# Patient Record
Sex: Male | Born: 1965 | Race: Black or African American | Hispanic: No | Marital: Married | Smoking: Current every day smoker
Health system: Southern US, Community
[De-identification: ages and names within clinical notes are randomized; demographics above are authoritative.]

## PROBLEM LIST (undated history)

## (undated) DIAGNOSIS — I10 Essential (primary) hypertension: Secondary | ICD-10-CM

## (undated) DIAGNOSIS — E7800 Pure hypercholesterolemia, unspecified: Secondary | ICD-10-CM

---

## 2005-07-12 ENCOUNTER — Ambulatory Visit: Payer: Self-pay | Admitting: Family Medicine

## 2005-07-26 ENCOUNTER — Encounter (INDEPENDENT_AMBULATORY_CARE_PROVIDER_SITE_OTHER): Payer: Self-pay | Admitting: Family Medicine

## 2006-03-12 ENCOUNTER — Encounter: Payer: Self-pay | Admitting: Family Medicine

## 2007-06-19 ENCOUNTER — Encounter (INDEPENDENT_AMBULATORY_CARE_PROVIDER_SITE_OTHER): Payer: Self-pay | Admitting: Family Medicine

## 2008-02-04 ENCOUNTER — Encounter (INDEPENDENT_AMBULATORY_CARE_PROVIDER_SITE_OTHER): Payer: Self-pay | Admitting: Family Medicine

## 2008-08-21 ENCOUNTER — Encounter (INDEPENDENT_AMBULATORY_CARE_PROVIDER_SITE_OTHER): Payer: Self-pay | Admitting: *Deleted

## 2008-10-15 ENCOUNTER — Ambulatory Visit: Payer: Self-pay | Admitting: Internal Medicine

## 2008-10-15 ENCOUNTER — Telehealth (INDEPENDENT_AMBULATORY_CARE_PROVIDER_SITE_OTHER): Payer: Self-pay

## 2008-10-15 DIAGNOSIS — K922 Gastrointestinal hemorrhage, unspecified: Secondary | ICD-10-CM | POA: Insufficient documentation

## 2008-10-15 DIAGNOSIS — F1010 Alcohol abuse, uncomplicated: Secondary | ICD-10-CM

## 2008-10-17 ENCOUNTER — Encounter: Payer: Self-pay | Admitting: Gastroenterology

## 2008-10-19 LAB — CONVERTED CEMR LAB
Basophils Absolute: 0 10*3/uL (ref 0.0–0.1)
HCT: 39.9 % (ref 39.0–52.0)
Hemoglobin: 13.4 g/dL (ref 13.0–17.0)
Lymphs Abs: 1 10*3/uL (ref 0.7–4.0)
MCHC: 33.6 g/dL (ref 30.0–36.0)
MCV: 89.3 fL (ref 78.0–100.0)
Monocytes Relative: 15 % — ABNORMAL HIGH (ref 3–12)
WBC: 6.5 10*3/uL (ref 4.0–10.5)

## 2008-10-22 ENCOUNTER — Ambulatory Visit (HOSPITAL_COMMUNITY): Admission: RE | Admit: 2008-10-22 | Discharge: 2008-10-22 | Payer: Self-pay | Admitting: Internal Medicine

## 2008-10-22 ENCOUNTER — Ambulatory Visit: Payer: Self-pay | Admitting: Internal Medicine

## 2008-10-22 ENCOUNTER — Encounter: Payer: Self-pay | Admitting: Internal Medicine

## 2008-10-22 HISTORY — PX: COLONOSCOPY: SHX174

## 2008-10-23 ENCOUNTER — Encounter: Payer: Self-pay | Admitting: Internal Medicine

## 2009-03-27 ENCOUNTER — Encounter: Payer: Self-pay | Admitting: Emergency Medicine

## 2009-03-27 ENCOUNTER — Observation Stay (HOSPITAL_COMMUNITY): Admission: EM | Admit: 2009-03-27 | Discharge: 2009-03-28 | Payer: Self-pay | Admitting: Emergency Medicine

## 2009-04-13 ENCOUNTER — Encounter: Admission: RE | Admit: 2009-04-13 | Discharge: 2009-04-13 | Payer: Self-pay | Admitting: Neurosurgery

## 2009-09-27 ENCOUNTER — Emergency Department (HOSPITAL_COMMUNITY): Admission: EM | Admit: 2009-09-27 | Discharge: 2009-09-27 | Payer: Self-pay | Admitting: Emergency Medicine

## 2010-03-08 NOTE — Letter (Signed)
Summary: office note  office note   Imported By: Chipper Herb 02/04/2008 11:09:40  _____________________________________________________________________  External Attachment:    Type:   Image     Comment:   External Document

## 2010-03-08 NOTE — Letter (Signed)
Summary: Medical history form  Medical history form   Imported By: Chipper Herb 02/04/2008 08:17:42  _____________________________________________________________________  External Attachment:    Type:   Image     Comment:   External Document

## 2010-04-22 LAB — COMPREHENSIVE METABOLIC PANEL
ALT: 74 U/L — ABNORMAL HIGH (ref 0–53)
AST: 62 U/L — ABNORMAL HIGH (ref 0–37)
Alkaline Phosphatase: 60 U/L (ref 39–117)
Calcium: 9.3 mg/dL (ref 8.4–10.5)
GFR calc Af Amer: >60 mL/min (ref >60)
Glucose, Bld: 98 mg/dL (ref 70–99)
Potassium: 4 mEq/L (ref 3.5–5.1)
Sodium: 133 mEq/L — ABNORMAL LOW (ref 135–145)

## 2010-04-22 LAB — CBC
HCT: 39.8 % (ref 39.0–52.0)
Hemoglobin: 13.5 g/dL (ref 13.0–17.0)
MCH: 30.1 pg (ref 26.0–34.0)
MCV: 88.4 fL (ref 78.0–100.0)
RDW: 14.2 % (ref 11.5–15.5)
WBC: 2.7 10*3/uL — ABNORMAL LOW (ref 4.0–10.5)

## 2010-04-22 LAB — DIFFERENTIAL
Basophils Relative: 0 % (ref 0–1)
Eosinophils Absolute: 0 10*3/uL (ref 0.0–0.7)
Lymphocytes Relative: 22 % (ref 12–46)
Monocytes Absolute: 0.8 10*3/uL (ref 0.1–1.0)
Monocytes Relative: 28 % — ABNORMAL HIGH (ref 3–12)
Neutro Abs: 1.3 10*3/uL — ABNORMAL LOW (ref 1.7–7.7)

## 2010-04-22 LAB — PROTIME-INR: INR: 0.97 (ref 0.00–1.49)

## 2010-04-27 LAB — BASIC METABOLIC PANEL
BUN: 12 mg/dL (ref 6–23)
CO2: 12 mEq/L — ABNORMAL LOW (ref 19–32)
Calcium: 10 mg/dL (ref 8.4–10.5)
GFR calc Af Amer: >60 mL/min (ref >60)
GFR calc non Af Amer: 52 mL/min — ABNORMAL LOW (ref >60)
Potassium: 3.1 mEq/L — ABNORMAL LOW (ref 3.5–5.1)
Sodium: 139 mEq/L (ref 135–145)

## 2010-04-27 LAB — PROTIME-INR: Prothrombin Time: 13.9 seconds (ref 11.6–15.2)

## 2010-04-27 LAB — RAPID URINE DRUG SCREEN, HOSP PERFORMED
Benzodiazepines: NOT DETECTED
Cocaine: NOT DETECTED
Tetrahydrocannabinol: POSITIVE — AB

## 2010-04-27 LAB — HEPATIC FUNCTION PANEL: Total Protein: 9.5 g/dL — ABNORMAL HIGH (ref 6.0–8.3)

## 2010-04-27 LAB — ETHANOL: Alcohol, Ethyl (B): <5 mg/dL (ref 0–10)

## 2010-04-27 LAB — APTT: aPTT: 30 seconds (ref 24–37)

## 2010-04-28 LAB — DIFFERENTIAL
Basophils Absolute: 0 10*3/uL (ref 0.0–0.1)
Lymphocytes Relative: 5 % — ABNORMAL LOW (ref 12–46)
Neutro Abs: 7.4 10*3/uL (ref 1.7–7.7)
Neutrophils Relative %: 82 % — ABNORMAL HIGH (ref 43–77)

## 2010-04-28 LAB — CBC
HCT: 38.5 % — ABNORMAL LOW (ref 39.0–52.0)
Hemoglobin: 13.5 g/dL (ref 13.0–17.0)
RBC: 4.35 MIL/uL (ref 4.22–5.81)
RDW: 14.4 % (ref 11.5–15.5)

## 2012-06-29 ENCOUNTER — Emergency Department (HOSPITAL_COMMUNITY)
Admission: EM | Admit: 2012-06-29 | Discharge: 2012-06-29 | Disposition: A | Discharge status: Home / Self Care | Payer: Self-pay | Attending: Emergency Medicine | Admitting: Emergency Medicine

## 2012-06-29 ENCOUNTER — Emergency Department (HOSPITAL_COMMUNITY): Payer: Self-pay

## 2012-06-29 ENCOUNTER — Encounter (HOSPITAL_COMMUNITY): Payer: Self-pay | Admitting: *Deleted

## 2012-06-29 ENCOUNTER — Encounter (HOSPITAL_COMMUNITY): Payer: Self-pay | Admitting: Emergency Medicine

## 2012-06-29 DIAGNOSIS — S0100XA Unspecified open wound of scalp, initial encounter: Secondary | ICD-10-CM | POA: Insufficient documentation

## 2012-06-29 DIAGNOSIS — Y9289 Other specified places as the place of occurrence of the external cause: Secondary | ICD-10-CM | POA: Insufficient documentation

## 2012-06-29 DIAGNOSIS — F17200 Nicotine dependence, unspecified, uncomplicated: Secondary | ICD-10-CM | POA: Insufficient documentation

## 2012-06-29 DIAGNOSIS — W010XXA Fall on same level from slipping, tripping and stumbling without subsequent striking against object, initial encounter: Secondary | ICD-10-CM | POA: Insufficient documentation

## 2012-06-29 DIAGNOSIS — W19XXXA Unspecified fall, initial encounter: Secondary | ICD-10-CM

## 2012-06-29 DIAGNOSIS — Y9301 Activity, walking, marching and hiking: Secondary | ICD-10-CM | POA: Insufficient documentation

## 2012-06-29 DIAGNOSIS — W1809XA Striking against other object with subsequent fall, initial encounter: Secondary | ICD-10-CM | POA: Insufficient documentation

## 2012-06-29 DIAGNOSIS — R000 Tachycardia, unspecified: Secondary | ICD-10-CM | POA: Insufficient documentation

## 2012-06-29 DIAGNOSIS — S0003XA Contusion of scalp, initial encounter: Secondary | ICD-10-CM | POA: Insufficient documentation

## 2012-06-29 DIAGNOSIS — S0083XA Contusion of other part of head, initial encounter: Secondary | ICD-10-CM | POA: Insufficient documentation

## 2012-06-29 DIAGNOSIS — S0101XA Laceration without foreign body of scalp, initial encounter: Secondary | ICD-10-CM

## 2012-06-29 DIAGNOSIS — S0180XA Unspecified open wound of other part of head, initial encounter: Secondary | ICD-10-CM | POA: Insufficient documentation

## 2012-06-29 DIAGNOSIS — S0003XD Contusion of scalp, subsequent encounter: Secondary | ICD-10-CM

## 2012-06-29 DIAGNOSIS — F1010 Alcohol abuse, uncomplicated: Secondary | ICD-10-CM | POA: Insufficient documentation

## 2012-06-29 DIAGNOSIS — IMO0002 Reserved for concepts with insufficient information to code with codable children: Secondary | ICD-10-CM | POA: Insufficient documentation

## 2012-06-29 DIAGNOSIS — R55 Syncope and collapse: Secondary | ICD-10-CM | POA: Insufficient documentation

## 2012-06-29 LAB — COMPREHENSIVE METABOLIC PANEL
ALT: 12 U/L (ref 0–53)
AST: 26 U/L (ref 0–37)
Albumin: 4.2 g/dL (ref 3.5–5.2)
CO2: 25 mEq/L (ref 19–32)
Calcium: 8.9 mg/dL (ref 8.4–10.5)
GFR calc Af Amer: >90 mL/min (ref >90)
Sodium: 135 mEq/L (ref 135–145)

## 2012-06-29 LAB — CBC
HCT: 41.5 % (ref 39.0–52.0)
MCH: 29.6 pg (ref 26.0–34.0)
RDW: 14.2 % (ref 11.5–15.5)
WBC: 4.7 10*3/uL (ref 4.0–10.5)

## 2012-06-29 MED ORDER — SODIUM CHLORIDE 0.9 % IV SOLN
INTRAVENOUS | Status: DC
  Administered 2012-06-29: 1000 mL via INTRAVENOUS

## 2012-06-29 MED ORDER — BACITRACIN-NEOMYCIN-POLYMYXIN 400-5-5000 EX OINT
TOPICAL_OINTMENT | Freq: Once | CUTANEOUS | Status: AC
  Administered 2012-06-29: 1 via TOPICAL
  Filled 2012-06-29: qty 1

## 2012-06-29 NOTE — ED Notes (Signed)
Pt had witnessed syncopal episode at home today at approximately 1300. Denies head/neck/back pain. Pt has laceration to head from tripping and falling at 0200 this am-seen in ED this am.

## 2012-06-29 NOTE — ED Notes (Signed)
Pt did very good walking around in the ER,

## 2012-06-29 NOTE — ED Notes (Signed)
Pt was walking home around midnight when he fell, when asked what caused him to fall pt states " I was intoxicated", pt hit head on sidewalk, c/o laceration to right posterior head, abrasion to left eyebrow area, right knee. Bleeding controlled at present, last tetanus shot was a year ago per pt. Denies any pain.

## 2012-06-29 NOTE — ED Provider Notes (Signed)
History     CSN: [id number]  Arrival date & time 06/29/12  0917   First MD Initiated Contact with Patient 06/29/12 [phone]      Chief Complaint  Patient presents with  . Head Laceration    (Consider location/radiation/quality/duration/timing/severity/associated sxs/prior treatment) HPI Leroy [name] is a 47 y.o. male who presents to the ED for a laceration to his head. He states he was walking home last night and fell due to intoxication. He fell the first time forward and scraped his right knee and left eyebrow. He got up and took a step backward and fell back and hit the right side of his head causing the laceration.  He denies LOC, states he remembers everything. Continued his walk home went to bed and this morning his mother made him come in to get the wound checked. Up to date on tetanus. The history was provided by the patient.  History reviewed. No pertinent past medical history.  History reviewed. No pertinent past surgical history.  No family history on file.  History  Substance Use Topics  . Smoking status: Current Every Day Smoker  . Smokeless tobacco: Not on file  . Alcohol Use: Yes      Review of Systems  Constitutional: Negative for fever and chills.  HENT: Negative for neck pain.   Eyes: Negative for visual disturbance.  Gastrointestinal: Negative for nausea and vomiting.  Musculoskeletal:       Abrasion to knee  Skin: Positive for wound.  Neurological: Negative for headaches.  Psychiatric/Behavioral: The patient is not nervous/anxious.     Allergies  Review of patient's allergies indicates no known allergies.  Home Medications  No current outpatient prescriptions on file.  BP 149/98  Pulse 109  Temp(Src) 98.4 F (36.9 C) (Oral)  Resp 20  Ht 6\' 3"  (1.905 m)  Wt 195 lb (88.451 kg)  BMI 24.37 kg/m2  SpO2 96%  Physical Exam  Nursing note and vitals reviewed. Constitutional: He is oriented to person, place, and time. He appears well-developed  and well-nourished. No distress.  HENT:  Head: Head is with laceration.    Right Ear: Tympanic membrane normal.  Left Ear: Tympanic membrane normal.  Nose: Nose normal.  Mouth/Throat: Uvula is midline, oropharynx is clear and moist and mucous membranes are normal. Normal dentition.  Abrasion left eyebrow area  Eyes: Conjunctivae and EOM are normal. Pupils are equal, round, and reactive to light.  Neck: Normal range of motion. Neck supple.  Cardiovascular: Tachycardia present.   Pulmonary/Chest: Effort normal.  Abdominal: Soft. There is no tenderness.  Musculoskeletal:  Abrasion right knee  Neurological: He is alert and oriented to person, place, and time. No cranial nerve deficit.  Skin: Skin is warm and dry.  Psychiatric: He has a normal mood and affect. His behavior is normal. Judgment and thought content normal.    ED Course  Procedures (including critical care time)   MDM  47 y.o. male with laceration and abrasions due to a fall last night. Scalp laceration without bleeding or signs of infection. Since the wound is over 12 hours old will not close. Abrasions cleaned, antibiotic ointment applied. Discussed with the patient reason for not closing the wound. Patient voices understanding. He will follow up for any problems.        [facility] Orlene Och, NP 06/29/12 1004

## 2012-06-29 NOTE — ED Provider Notes (Addendum)
History     CSN: [id number]  Arrival date & time 06/29/12  1333   First MD Initiated Contact with Patient 06/29/12 1346      Chief Complaint  Patient presents with  . Loss of Consciousness    (Consider location/radiation/quality/duration/timing/severity/associated sxs/prior treatment) Patient is a 47 y.o. male presenting with syncope. The history is provided by the patient.  Loss of Consciousness Associated symptoms: no chest pain, no confusion, no dizziness, no fever, no headaches, no palpitations, no seizures, no shortness of breath, no vomiting and no weakness   pt indicates was in kitchen about to fix self something to eat, when felt lightheaded/faint, ?brief syncopal event. No sz activity or post ictal period. No incontinence. Was seen in ED earlier this morning after having a trip and fall last night. Pt indicates last night was walking home, intoxicated, when tripped and fell, hit head. Was able to get self up and go home, denies loc then. Slight headache. No severe head pain. No nv. Denies neck or back pain. Had scalp lac - given time of presentation earlier today since injury, wound care and wound left open. Tetanus utd within 1 yr per pt and family.  Pt denies palpitations or sense of irregular heart beat. No hx dysrhythmia. Denies any current or recent cp or discomfort. No hx cad. No sob or unusual doe. No leg pain or swelling. No dvt or pe hx. Denies abd pain. No nvd. No black stools, melena, or rectal bleeding. No gu c/o. No recent change in meds or new meds.  States had fainting episode 2-3 yrs ago, no cause found. No other recent fainting episodes.     History reviewed. No pertinent past medical history.  No past surgical history on file.  No family history on file.  History  Substance Use Topics  . Smoking status: Current Every Day Smoker  . Smokeless tobacco: Not on file  . Alcohol Use: Yes      Review of Systems  Constitutional: Negative for fever and chills.   HENT: Negative for neck pain and neck stiffness.   Eyes: Negative for pain and visual disturbance.  Respiratory: Negative for cough and shortness of breath.   Cardiovascular: Positive for syncope. Negative for chest pain, palpitations and leg swelling.  Gastrointestinal: Negative for vomiting, abdominal pain, diarrhea and blood in stool.  Genitourinary: Negative for dysuria and flank pain.  Musculoskeletal: Negative for back pain.  Skin: Positive for wound. Negative for rash.  Neurological: Negative for dizziness, seizures, weakness, numbness and headaches.  Hematological: Does not bruise/bleed easily.  Psychiatric/Behavioral: Negative for confusion.    Allergies  Review of patient's allergies indicates no known allergies.  Home Medications  No current outpatient prescriptions on file.  BP 106/73  Pulse 89  Temp(Src) 97.9 F (36.6 C) (Oral)  Resp 20  Ht 6\' 3"  (1.905 m)  Wt 195 lb (88.451 kg)  BMI 24.37 kg/m2  SpO2 96%  Physical Exam  Nursing note and vitals reviewed. Constitutional: He is oriented to person, place, and time. He appears well-developed and well-nourished. No distress.  HENT:  Mouth/Throat: Oropharynx is clear and moist.  3.5 cm scalp laceration, wound edges well approximated without sign of infection. No oral injury or contusion.   Eyes: Conjunctivae and EOM are normal. Pupils are equal, round, and reactive to light.  Neck: Normal range of motion. Neck supple. No tracheal deviation present.  No bruit  Cardiovascular: Normal rate, regular rhythm, normal heart sounds and intact distal pulses.  Exam  reveals no gallop and no friction rub.   No murmur heard. Pulmonary/Chest: Effort normal and breath sounds normal. No accessory muscle usage. No respiratory distress.  Abdominal: Soft. Bowel sounds are normal. He exhibits no distension and no mass. There is no tenderness. There is no rebound and no guarding.  Genitourinary:  No cva tenderness  Musculoskeletal:  Normal range of motion. He exhibits no edema and no tenderness.  CTLS spine, non tender, aligned, no step off.   Neurological: He is alert and oriented to person, place, and time.  Motor intact bil. Steady gait.   Skin: Skin is warm and dry. He is not diaphoretic.  Psychiatric: He has a normal mood and affect.    ED Course  Procedures (including critical care time)  Results for orders placed during the hospital encounter of 06/29/12  CBC      Result Value Range   WBC 4.7  4.0 - 10.5 K/uL   RBC 4.76  4.22 - 5.81 MIL/uL   Hemoglobin 14.1  13.0 - 17.0 g/dL   HCT 45.4  09.8 - 11.9 %   MCV 87.2  78.0 - 100.0 fL   MCH 29.6  26.0 - 34.0 pg   MCHC 34.0  30.0 - 36.0 g/dL   RDW 14.7  82.9 - 56.2 %   Platelets 180  150 - 400 K/uL  COMPREHENSIVE METABOLIC PANEL      Result Value Range   Sodium 135  135 - 145 mEq/L   Potassium 3.8  3.5 - 5.1 mEq/L   Chloride 96  96 - 112 mEq/L   CO2 25  19 - 32 mEq/L   Glucose, Bld 105 (*) 70 - 99 mg/dL   BUN 6  6 - 23 mg/dL   Creatinine, Ser 1.30  0.50 - 1.35 mg/dL   Calcium 8.9  8.4 - 86.5 mg/dL   Total Protein 8.6 (*) 6.0 - 8.3 g/dL   Albumin 4.2  3.5 - 5.2 g/dL   AST 26  0 - 37 U/L   ALT 12  0 - 53 U/L   Alkaline Phosphatase 77  39 - 117 U/L   Total Bilirubin 0.3  0.3 - 1.2 mg/dL   GFR calc non Af Amer >90  >90 mL/min   GFR calc Af Amer >90  >90 mL/min   Ct Head Wo Contrast  06/29/2012   *RADIOLOGY REPORT*  Clinical Data: Syncope, head injury  CT HEAD WITHOUT CONTRAST  Technique:  Contiguous axial images were obtained from the base of the skull through the vertex without contrast.  Comparison: 04/13/2009  Findings: No evidence of parenchymal hemorrhage or extra-axial fluid collection. No mass lesion, mass effect, or midline shift.  No CT evidence of acute infarction.  Cerebral volume is age appropriate.  No ventriculomegaly.  The visualized paranasal sinuses are essentially clear. The mastoid air cells are unopacified.  No evidence of calvarial  fracture.  IMPRESSION: No evidence of acute intracranial abnormality.   Original Report Authenticated By: Charline Bills, M.D.      MDM  Iv ns. Monitor. Ecg. Labs. Ct.  Reviewed nursing notes and prior charts for additional history.    Date: 06/29/2012  Rate: 78  Rhythm: normal sinus rhythm  QRS Axis: normal  Intervals: normal  ST/T Wave abnormalities: normal  Conduction Disutrbances:none  Narrative Interpretation:   Old EKG Reviewed: none available  Recheck pt remains asymptomatic. Ambulatory about ed. No faintness or dizziness. Nsr on monitor.   Pt appears stable for d/c. Given ?syncopal  event, follow up cardiology in coming week.   Pt tolerating po fluids, meal.  Remains asymptomatic. No c/o pain. No faintness. No tremor, shakes, or sweats. No etoh withdrawal symptoms. Vitals normal.        Suzi Roots, MD 06/29/12 1530  Suzi Roots, MD 06/29/12 [phone]

## 2012-07-02 NOTE — ED Provider Notes (Signed)
Medical screening examination/treatment/procedure(s) were performed by non-physician practitioner and as supervising physician I was immediately available for consultation/collaboration.    Laray Anger, DO 07/02/12 1237

## 2012-07-26 ENCOUNTER — Encounter: Payer: Self-pay | Admitting: Cardiology

## 2012-07-26 ENCOUNTER — Ambulatory Visit (INDEPENDENT_AMBULATORY_CARE_PROVIDER_SITE_OTHER): Payer: Self-pay | Admitting: Cardiology

## 2012-07-26 VITALS — BP 120/82 | HR 86 | Ht 75.0 in | Wt 177.0 lb

## 2012-07-26 DIAGNOSIS — F1010 Alcohol abuse, uncomplicated: Secondary | ICD-10-CM

## 2012-07-26 DIAGNOSIS — I951 Orthostatic hypotension: Secondary | ICD-10-CM

## 2012-07-26 NOTE — Patient Instructions (Addendum)
Your physician recommends that you continue on your current medications as directed. Please refer to the Current Medication list given to you today.  Your physician recommends that you schedule a follow-up appointment as needed with our office.  Remember to stay well hydrated.

## 2012-07-26 NOTE — Assessment & Plan Note (Signed)
His symptoms are clearly related to change in position with what sounds like near-syncope. His pressure drops son today with standing but his heart rate increases dramatically. Much of this may be related to alcohol. His mother agrees. I've encouraged him to drink plenty of good fluids including water and Gatorade. I've encouraged him not to drink alcohol. He can also add salt to his diet. No further cardiac workup.

## 2012-07-26 NOTE — Progress Notes (Signed)
HPI Leroy [name] is a 47 year old African American male who comes in with recurrent near-syncope. He notices this most when he goes from lying or sitting to standing. He has hit his head a number of times through the years requiring CT scan. His last significant fall was in May. CT scan at that time showed no acute intracranial abnormality. He has a head laceration from a fall in the past as well.  He has a long history of alcohol abuse. His mother is with him in the room and confirmed that he still drinks more than he admits. He does drink a lot of water at work and Gatorade. He has not had any bleeding but has had a GI bleed in the past. He denies any melena.  His only symptom before he falls his dizziness. It is not vertigo. He has no chest pain, palpitations, or shortness of breath.  History reviewed. No pertinent past medical history.  No current outpatient prescriptions on file.   No current facility-administered medications for this visit.    No Known Allergies  No family history on file.  History   Social History  . Marital Status: Married    Spouse Name: N/A    Number of Children: N/A  . Years of Education: N/A   Occupational History  . Not on file.   Social History Main Topics  . Smoking status: Current Every Day Smoker  . Smokeless tobacco: Not on file  . Alcohol Use: 0.0 oz/week  . Drug Use: Not on file  . Sexually Active: Not on file   Other Topics Concern  . Not on file   Social History Narrative  . No narrative on file    ROS ALL NEGATIVE EXCEPT THOSE NOTED IN HPI  PE  General Appearance: well developed, well nourished in no acute distress, looks older than stated age HEENT: symmetrical face, PERRLA, sclera are muddy but nonicteric  Neck: no JVD, thyromegaly, or adenopathy, trachea midline Chest: symmetric without deformity Cardiac: PMI non-displaced, RRR, normal S1, S2, no gallop or murmur, has S4 the apex Lung: clear to ausculation and  percussion Vascular: all pulses full without bruits  Abdominal: nondistended, nontender, good bowel sounds, no HSM, no bruits Extremities: no cyanosis, clubbing or edema, no sign of DVT, no varicosities  Skin: normal color, no rashes Neuro: alert and oriented x 3, non-focal Pysch: normal affect  EKG Normal sinus rhythm, essentially normal EKG BMET    Component Value Date/Time   NA 135 06/29/2012 1422   K 3.8 06/29/2012 1422   CL 96 06/29/2012 1422   CO2 25 06/29/2012 1422   GLUCOSE 105* 06/29/2012 1422   BUN 6 06/29/2012 1422   CREATININE 0.77 06/29/2012 1422   CALCIUM 8.9 06/29/2012 1422   GFRNONAA >90 06/29/2012 1422   GFRAA >90 06/29/2012 1422    Lipid Panel  No results found for this basename: chol, trig, hdl, cholhdl, vldl, ldlcalc    CBC    Component Value Date/Time   WBC 4.7 06/29/2012 1422   RBC 4.76 06/29/2012 1422   HGB 14.1 06/29/2012 1422   HCT 41.5 06/29/2012 1422   PLT 180 06/29/2012 1422   MCV 87.2 06/29/2012 1422   MCH 29.6 06/29/2012 1422   MCHC 34.0 06/29/2012 1422   RDW 14.2 06/29/2012 1422   LYMPHSABS 0.6* 09/27/2009 0858   MONOABS 0.8 09/27/2009 0858   EOSABS 0.0 09/27/2009 0858   BASOSABS 0.0 09/27/2009 0858

## 2013-11-02 ENCOUNTER — Encounter (HOSPITAL_COMMUNITY): Payer: Self-pay | Admitting: Emergency Medicine

## 2013-11-02 ENCOUNTER — Emergency Department (HOSPITAL_COMMUNITY): Payer: BC Managed Care – PPO

## 2013-11-02 ENCOUNTER — Emergency Department (HOSPITAL_COMMUNITY)
Admission: EM | Admit: 2013-11-02 | Discharge: 2013-11-02 | Disposition: A | Discharge status: Home / Self Care | Payer: BC Managed Care – PPO | Attending: Emergency Medicine | Admitting: Emergency Medicine

## 2013-11-02 DIAGNOSIS — F17200 Nicotine dependence, unspecified, uncomplicated: Secondary | ICD-10-CM | POA: Insufficient documentation

## 2013-11-02 DIAGNOSIS — Y9289 Other specified places as the place of occurrence of the external cause: Secondary | ICD-10-CM | POA: Diagnosis not present

## 2013-11-02 DIAGNOSIS — S82409A Unspecified fracture of shaft of unspecified fibula, initial encounter for closed fracture: Secondary | ICD-10-CM | POA: Insufficient documentation

## 2013-11-02 DIAGNOSIS — Y9389 Activity, other specified: Secondary | ICD-10-CM | POA: Insufficient documentation

## 2013-11-02 DIAGNOSIS — X503XXA Overexertion from repetitive movements, initial encounter: Secondary | ICD-10-CM | POA: Insufficient documentation

## 2013-11-02 DIAGNOSIS — S99919A Unspecified injury of unspecified ankle, initial encounter: Secondary | ICD-10-CM

## 2013-11-02 DIAGNOSIS — S82402A Unspecified fracture of shaft of left fibula, initial encounter for closed fracture: Secondary | ICD-10-CM

## 2013-11-02 DIAGNOSIS — X500XXA Overexertion from strenuous movement or load, initial encounter: Secondary | ICD-10-CM | POA: Insufficient documentation

## 2013-11-02 DIAGNOSIS — S8990XA Unspecified injury of unspecified lower leg, initial encounter: Secondary | ICD-10-CM | POA: Diagnosis present

## 2013-11-02 DIAGNOSIS — Z79899 Other long term (current) drug therapy: Secondary | ICD-10-CM | POA: Insufficient documentation

## 2013-11-02 DIAGNOSIS — S99929A Unspecified injury of unspecified foot, initial encounter: Secondary | ICD-10-CM

## 2013-11-02 DIAGNOSIS — W108XXA Fall (on) (from) other stairs and steps, initial encounter: Secondary | ICD-10-CM | POA: Diagnosis not present

## 2013-11-02 MED ORDER — NAPROXEN 500 MG PO TABS: 500.0000 mg | ORAL_TABLET | Freq: Two times a day (BID) | ORAL | Status: DC

## 2013-11-02 MED ORDER — HYDROCODONE-ACETAMINOPHEN 5-325 MG PO TABS: 1.0000 | ORAL_TABLET | Freq: Four times a day (QID) | ORAL | Status: DC | PRN

## 2013-11-02 NOTE — ED Notes (Signed)
Pt arrives on crutches, pt steps off porch on Thursday evening and fell, left foot swelling and pain

## 2013-11-02 NOTE — Discharge Instructions (Signed)
Elevate your foot. Use ice packs to get the swelling down. Take the naproxen with food for pain (will not make you sleepy). Take the hydrocodone for pain not controlled by the naproxen. Wear the cam walker boot and use your crutches as needed to help your walk. Call Dr Harrison's office to have him recheck your ankle later this week.  Fibular Fracture, Ankle, Adult, Treated With or Without Immobilization A fibular fracture at your ankle is a break (fracture) bone in the smallest of the two bones in your lower leg, located on the outside of your leg (fibula) close to the area at your ankle joint. CAUSES  Rolling your ankle.  Twisting your ankle.  Extreme flexing or extending of your foot.  Severe force on your ankle as when falling from a distance. RISK FACTORS  Jumping activities.  Participation in sports.  Osteoporosis.  Advanced age.  Previous ankle injuries. SIGNS AND SYMPTOMS  Pain.  Swelling.  Inability to put weight on injured ankle.  Bruising.  Bone deformities at site of injury. DIAGNOSIS  This fracture is diagnosed with the help of an X-ray exam. TREATMENT  If the fractured bone did not move out of place it usually will heal without problems and does casting or splinting. If immobilization is needed for comfort or the fractured bone moved out of place and will not heal properly with immobilization, a cast or splint will be used. HOME CARE INSTRUCTIONS   Apply ice to the area of injury:  Put ice in a plastic bag.  Place a towel between your skin and the bag.  Leave the ice on for 20 minutes, 2-3 times a day.  Use crutches as directed. Resume walking without crutches as directed by your health care provider.  Only take over-the-counter or prescription medicines for pain, discomfort, or fever as directed by your health care provider.  If you have a removable splint or boot, do not remove the boot unless directed by your health care provider. SEEK MEDICAL  CARE IF:   You have continued pain or more swelling  The medications do not control the pain. SEEK IMMEDIATE MEDICAL CARE IF:  You develop severe pain in the leg or foot.  Your skin or nails below the injury turn blue or grey or feel cold or numb. MAKE SURE YOU:   Understand these instructions.  Will watch your condition.  Will get help right away if you are not doing well or get worse. Document Released: 01/23/2005 Document Revised: 11/13/2012 Document Reviewed: 09/04/2012 [facility] Patient Information 2015 [place], [place]. This information is not intended to replace advice given to you by your health care provider. Make sure you discuss any questions you have with your health care provider.

## 2013-11-02 NOTE — ED Provider Notes (Signed)
CSN: [id number]     Arrival date & time 11/02/13  1216 History   First MD Initiated Contact with Patient 11/02/13 1241     Chief Complaint  Patient presents with  . Foot Injury     (Consider location/radiation/quality/duration/timing/severity/associated sxs/prior Treatment) HPI Patient states he was coming off his porch 3 nights ago and he missed the last step. He states he fell and twisted his left ankle. He denies hitting his head or having any other injury other than to his left ankle. He states he did not have pain initially however the following morning when he woke up he started having pain and progressive swelling of his left ankle. He denies any numbness in his extremities. He describes the pain as throbbing. Patient states he is limping and has actually started using crutches. Patient states he stands at his work and he was unable to go to work 2 nights ago.  PCP none Ortho none  History reviewed. No pertinent past medical history. History reviewed. No pertinent past surgical history. History reviewed. No pertinent family history. History  Substance Use Topics  . Smoking status: Current Every Day Smoker -- 0.50 packs/day    Types: Cigarettes  . Smokeless tobacco: Not on file  . Alcohol Use: 1.2 oz/week    2 Cans of beer per week  employed Patient states he drinks 2 beers daily  Review of Systems  All other systems reviewed and are negative.     Allergies  Review of patient's allergies indicates no known allergies.  Home Medications   Prior to Admission medications   Medication Sig Start Date End Date Taking? Authorizing Provider  HYDROcodone-acetaminophen (NORCO/VICODIN) 5-325 MG per tablet Take 1-2 tablets by mouth every 6 (six) hours as needed for moderate pain. 11/02/13   Janice Norrie, MD  naproxen (NAPROSYN) 500 MG tablet Take 1 tablet (500 mg total) by mouth 2 (two) times daily. 11/02/13   Janice Norrie, MD   BP 138/96  Pulse 99  Temp(Src) 99.5 F (37.5 C)  (Oral)  Resp 18  Ht 6\' 3"  (1.905 m)  Wt 180 lb (81.647 kg)  BMI 22.50 kg/m2  SpO2 100%  Vital signs normal   Physical Exam  Nursing note and vitals reviewed. Constitutional: He is oriented to person, place, and time. He appears well-developed and well-nourished.  Non-toxic appearance. He does not appear ill. No distress.  HENT:  Head: Normocephalic and atraumatic.  Right Ear: External ear normal.  Left Ear: External ear normal.  Nose: Nose normal. No mucosal edema or rhinorrhea.  Mouth/Throat: Mucous membranes are normal. No dental abscesses or uvula swelling.  Eyes: Conjunctivae and EOM are normal. Pupils are equal, round, and reactive to light.  Neck: Normal range of motion and full passive range of motion without pain. Neck supple.  Pulmonary/Chest: Effort normal. No respiratory distress. He has no rhonchi. He exhibits no crepitus.  Abdominal: Normal appearance.  Musculoskeletal: Normal range of motion. He exhibits edema and tenderness.  Patient is noted to have diffuse swelling of his left lower leg especially around his ankles bilaterally. He has swelling of the dorsum of the foot. His foot is nontender to palpation. He has tenderness to palpation over his malleoli bilaterally. His proximal fibula and knee are nontender, there's no joint effusion noted. Patient has good capillary refill.  Neurological: He is alert and oriented to person, place, and time. He has normal strength. No cranial nerve deficit.  Skin: Skin is warm, dry and intact. No rash  noted. No erythema. No pallor.  Psychiatric: He has a normal mood and affect. His speech is normal and behavior is normal. His mood appears not anxious.    ED Course  Procedures (including critical care time)  Patient refused pain medication while in the ED.  1415 I gave the patient copies of his x-rays. We discussed he has a distal fibular fracture. Patient was placed in a CAM walker. Patient already has crutches.    Labs  Review Labs Reviewed - No data to display  Imaging Review Dg Ankle Complete Left  11/02/2013   CLINICAL DATA:  Left ankle and foot pain, swelling, injury  EXAM: LEFT ANKLE COMPLETE - 3+ VIEW  COMPARISON:  None.  FINDINGS: There is an acute minimally displaced oblique fracture of the left distal fibula. Tibia, talus and calcaneus appear intact. Diffuse ankle soft tissue swelling.  IMPRESSION: Acute minimally displaced left distal fibula fracture.   Electronically Signed   By: Daryll Brod M.D.   On: 11/02/2013 14:49     EKG Interpretation None      MDM   Final diagnoses:  Closed fibular fracture, left, initial encounter    Discharge Medication List as of 11/02/2013  2:26 PM    START taking these medications   Details  HYDROcodone-acetaminophen (NORCO/VICODIN) 5-325 MG per tablet Take 1-2 tablets by mouth every 6 (six) hours as needed for moderate pain., Starting 11/02/2013, Until Discontinued, Print    naproxen (NAPROSYN) 500 MG tablet Take 1 tablet (500 mg total) by mouth 2 (two) times daily., Starting 11/02/2013, Until Discontinued, Print        Plan discharge  Rolland Porter, MD, Alanson Aly, MD 11/02/13 [phone]

## 2013-11-02 NOTE — ED Notes (Signed)
Patient crutches with wife at bedside

## 2013-11-05 ENCOUNTER — Ambulatory Visit: Payer: BC Managed Care – PPO | Admitting: Orthopedic Surgery

## 2013-11-17 ENCOUNTER — Encounter: Payer: Self-pay | Admitting: Orthopedic Surgery

## 2013-11-17 ENCOUNTER — Ambulatory Visit (INDEPENDENT_AMBULATORY_CARE_PROVIDER_SITE_OTHER): Payer: BC Managed Care – PPO | Admitting: Orthopedic Surgery

## 2013-11-17 ENCOUNTER — Ambulatory Visit (INDEPENDENT_AMBULATORY_CARE_PROVIDER_SITE_OTHER): Payer: BC Managed Care – PPO

## 2013-11-17 VITALS — HR 76 | Resp 18 | Ht 75.0 in | Wt 180.0 lb

## 2013-11-17 DIAGNOSIS — S8262XA Displaced fracture of lateral malleolus of left fibula, initial encounter for closed fracture: Secondary | ICD-10-CM

## 2013-11-17 DIAGNOSIS — S82892A Other fracture of left lower leg, initial encounter for closed fracture: Secondary | ICD-10-CM

## 2013-11-17 DIAGNOSIS — S8263XA Displaced fracture of lateral malleolus of unspecified fibula, initial encounter for closed fracture: Secondary | ICD-10-CM | POA: Insufficient documentation

## 2013-11-17 NOTE — Patient Instructions (Addendum)
OUT OF WORK 4 WEEKS

## 2013-11-17 NOTE — Progress Notes (Signed)
Subjective:   Chief Complaint  Patient presents with  . Fracture    Acute minimally displaced left distal fibula fracture DOI 11/02/13     Leroy [name] is a 48 y.o. male who presents with left ankle pain. Onset of the symptoms was 11/06/2013. Inciting event: injured while In his yard slipped on some grass. Current symptoms include: ability to bear weight, but with some pain, bruising, pain at the lateral aspect of the ankle, stiffness and swelling. Aggravating factors: walking  and weight bearing. Symptoms have stabilized. Patient has had no prior ankle problems. Evaluation to date: plain films: Show what appears to be widening of the medial clear space  and Lateral malleolus fracture. Treatment to date: Long cam walker. The following portions of the patient's history were reviewed and updated as appropriate: allergies, current medications, past family history, past medical history, past social history, past surgical history and problem list.   A complete and thorough review of systems was performed. The patient complains of blood in his stool in the past. Medical history of alcoholism otherwise normal no previous surgeries no medications and no allergies  Family history COPD hypertension and alcoholism  History of half pack per day smoking and 324 ounce beers per day history of marijuana use.   Objective:    Ht 6\' 3"  (1.905 m)  Wt 180 lb (81.647 kg)  BMI 22.50 kg/m2 The patient's appearance is well-groomed. His body habitus is ectomorphic. He is oriented x3 Person Pl. And time Mood pleasant affect normal Ambulation without assistive device but he is wearing a Cam Walker and walking well Right ankle:   normal  Left ankle:   This tenderness and swelling over the lateral malleolus no tenderness medially. His foot is plantigrade his range of motion is approximately 25. His ankle is stable. Muscle tone is normal. Skin is intact. Dorsalis pedis pulses normal temperature is normal mild  edema from the fractures noted. He has normal sensation in the foot.   Imaging: X-ray of the left ankle(s): fracture of Lateral malleolus with an intact ankle mortise on today's repeat film. My interpretation of the original films at the ankle mortise was widened with the medial malleolus intact and the lateral malleolus fracture.    Assessment:    Fracture of Lateral malleolus    Plan:     weightbearing as tolerated in long Cam Walker, out of work 4 weeks, repeat x-ray in 4 weeks of fracture healing is noted and fracture is stable I've asked the patient to bring his shoe so that we can attempt to place him in an Aircast so that he can go back to work

## 2013-12-15 ENCOUNTER — Ambulatory Visit (INDEPENDENT_AMBULATORY_CARE_PROVIDER_SITE_OTHER): Payer: Self-pay | Admitting: Orthopedic Surgery

## 2013-12-15 ENCOUNTER — Ambulatory Visit (INDEPENDENT_AMBULATORY_CARE_PROVIDER_SITE_OTHER): Payer: BC Managed Care – PPO

## 2013-12-15 ENCOUNTER — Encounter: Payer: Self-pay | Admitting: Orthopedic Surgery

## 2013-12-15 VITALS — BP 141/97 | Ht 75.0 in | Wt 180.0 lb

## 2013-12-15 DIAGNOSIS — S82892A Other fracture of left lower leg, initial encounter for closed fracture: Secondary | ICD-10-CM

## 2013-12-15 DIAGNOSIS — S8262XD Displaced fracture of lateral malleolus of left fibula, subsequent encounter for closed fracture with routine healing: Secondary | ICD-10-CM

## 2013-12-15 NOTE — Patient Instructions (Signed)
RTW TUES  WEAR AIR CAST 1 MONTH

## 2013-12-15 NOTE — Progress Notes (Signed)
Patient ID: Leroy [name], male   DOB: 1965/07/05, 48 y.o.   MRN: [id number] Chief Complaint  Patient presents with  . Follow-up    4 week recheck on left ankle fracture. DOI 11-02-13.   Encounter Diagnoses  Name Primary?  Marland Kitchen Ankle fracture, left   . Fractured lateral malleolus, left, closed, with routine healing, subsequent encounter Yes   BP 141/97 mmHg  Ht 6\' 3"  (1.905 m)  Wt 180 lb (81.647 kg)  BMI 22.50 kg/m2  The patient says he has no pain in his ankle at this time and clinically has no tenderness without deformity x-ray shows fracture callus with visible fracture line  He can return to work Tuesday with a Aircast which she is to wear for one month  Otherwise release unless he has problems

## 2018-10-17 ENCOUNTER — Encounter: Payer: Self-pay | Admitting: Internal Medicine

## 2018-11-20 ENCOUNTER — Encounter: Payer: Self-pay | Admitting: Internal Medicine

## 2018-12-17 ENCOUNTER — Ambulatory Visit: Payer: Self-pay | Admitting: Nurse Practitioner

## 2018-12-17 ENCOUNTER — Telehealth: Payer: Self-pay | Admitting: Internal Medicine

## 2018-12-17 ENCOUNTER — Encounter: Payer: Self-pay | Admitting: Internal Medicine

## 2018-12-17 NOTE — Telephone Encounter (Signed)
PATIENT WAS A NO SHOW AND LETTER SENT  °

## 2018-12-17 NOTE — Progress Notes (Deleted)
Primary Care Physician:  Lucia Gaskins, MD Primary Gastroenterologist:  Dr. Gala Romney  No chief complaint on file.   HPI:   Leroy [name] is a 53 y.o. male who presents on referral from primary care provider to schedule colonoscopy.  Nurse/phone triage was deferred office visit due to alcohol intake, medications, and last procedure requiring an increased amount of sedation.  It appears there is a colonoscopy completed in 2010, however the system is having difficulty/air displaying the report.  However colonoscopy patient noticed letter indicates recommended 10-year repeat (2020).  He is currently due.  Today he states   No past medical history on file.  No past surgical history on file.  Current Outpatient Medications  Medication Sig Dispense Refill  . HYDROcodone-acetaminophen (NORCO/VICODIN) 5-325 MG per tablet Take 1-2 tablets by mouth every 6 (six) hours as needed for moderate pain. 30 tablet 0  . naproxen (NAPROSYN) 500 MG tablet Take 1 tablet (500 mg total) by mouth 2 (two) times daily. 30 tablet 0   No current facility-administered medications for this visit.     Allergies as of 12/17/2018  . (No Known Allergies)    No family history on file.  Social History   Socioeconomic History  . Marital status: Married    Spouse name: Not on file  . Number of children: Not on file  . Years of education: Not on file  . Highest education level: Not on file  Occupational History  . Not on file  Social Needs  . Financial resource strain: Not on file  . Food insecurity    Worry: Not on file    Inability: Not on file  . Transportation needs    Medical: Not on file    Non-medical: Not on file  Tobacco Use  . Smoking status: Current Every Day Smoker    Packs/day: 0.50    Types: Cigarettes  Substance and Sexual Activity  . Alcohol use: Yes    Alcohol/week: 2.0 standard drinks    Types: 2 Cans of beer per week  . Drug use: Yes    Types: Marijuana  . Sexual  activity: Not on file  Lifestyle  . Physical activity    Days per week: Not on file    Minutes per session: Not on file  . Stress: Not on file  Relationships  . Social Herbalist on phone: Not on file    Gets together: Not on file    Attends religious service: Not on file    Active member of club or organization: Not on file    Attends meetings of clubs or organizations: Not on file    Relationship status: Not on file  . Intimate partner violence    Fear of current or ex partner: Not on file    Emotionally abused: Not on file    Physically abused: Not on file    Forced sexual activity: Not on file  Other Topics Concern  . Not on file  Social History Narrative  . Not on file    Review of Systems: General: Negative for anorexia, weight loss, fever, chills, fatigue, weakness. Eyes: Negative for vision changes.  ENT: Negative for hoarseness, difficulty swallowing , nasal congestion. CV: Negative for chest pain, angina, palpitations, dyspnea on exertion, peripheral edema.  Respiratory: Negative for dyspnea at rest, dyspnea on exertion, cough, sputum, wheezing.  GI: See history of present illness. GU:  Negative for dysuria, hematuria, urinary incontinence, urinary frequency, nocturnal urination.  MS:  Negative for joint pain, low back pain.  Derm: Negative for rash or itching.  Neuro: Negative for weakness, abnormal sensation, seizure, frequent headaches, memory loss, confusion.  Psych: Negative for anxiety, depression, suicidal ideation, hallucinations.  Endo: Negative for unusual weight change.  Heme: Negative for bruising or bleeding. Allergy: Negative for rash or hives.    Physical Exam: There were no vitals taken for this visit. General:   Alert and oriented. Pleasant and cooperative. Well-nourished and well-developed.  Head:  Normocephalic and atraumatic. Eyes:  Without icterus, sclera clear and conjunctiva pink.  Ears:  Normal auditory acuity. Mouth:  No  deformity or lesions, oral mucosa pink.  Throat/Neck:  Supple, without mass or thyromegaly. Cardiovascular:  S1, S2 present without murmurs appreciated. Normal pulses noted. Extremities without clubbing or edema. Respiratory:  Clear to auscultation bilaterally. No wheezes, rales, or rhonchi. No distress.  Gastrointestinal:  +BS, soft, non-tender and non-distended. No HSM noted. No guarding or rebound. No masses appreciated.  Rectal:  Deferred  Musculoskalatal:  Symmetrical without gross deformities. Normal posture. Skin:  Intact without significant lesions or rashes. Neurologic:  Alert and oriented x4;  grossly normal neurologically. Psych:  Alert and cooperative. Normal mood and affect. Heme/Lymph/Immune: No significant cervical adenopathy. No excessive bruising noted.    12/17/2018 7:39 AM   Disclaimer: This note was dictated with voice recognition software. Similar sounding words can inadvertently be transcribed and may not be corrected upon review.

## 2019-01-13 ENCOUNTER — Emergency Department (HOSPITAL_COMMUNITY): Payer: BC Managed Care – PPO

## 2019-01-13 ENCOUNTER — Other Ambulatory Visit: Payer: Self-pay

## 2019-01-13 ENCOUNTER — Encounter (HOSPITAL_COMMUNITY): Payer: Self-pay

## 2019-01-13 ENCOUNTER — Emergency Department (HOSPITAL_COMMUNITY)
Admission: EM | Admit: 2019-01-13 | Discharge: 2019-01-13 | Disposition: A | Discharge status: Home / Self Care | Payer: BC Managed Care – PPO | Attending: Emergency Medicine | Admitting: Emergency Medicine

## 2019-01-13 DIAGNOSIS — I10 Essential (primary) hypertension: Secondary | ICD-10-CM | POA: Insufficient documentation

## 2019-01-13 DIAGNOSIS — R0789 Other chest pain: Secondary | ICD-10-CM | POA: Insufficient documentation

## 2019-01-13 DIAGNOSIS — F17210 Nicotine dependence, cigarettes, uncomplicated: Secondary | ICD-10-CM | POA: Diagnosis not present

## 2019-01-13 DIAGNOSIS — R079 Chest pain, unspecified: Secondary | ICD-10-CM

## 2019-01-13 HISTORY — DX: Pure hypercholesterolemia, unspecified: E78.00

## 2019-01-13 HISTORY — DX: Essential (primary) hypertension: I10

## 2019-01-13 LAB — BASIC METABOLIC PANEL
Anion gap: 8 (ref 5–15)
BUN: 8 mg/dL (ref 6–20)
CO2: 23 mmol/L (ref 22–32)
Calcium: 9 mg/dL (ref 8.9–10.3)
Chloride: 103 mmol/L (ref 98–111)
Creatinine, Ser: 0.75 mg/dL (ref 0.61–1.24)
GFR calc Af Amer: >60 mL/min (ref >60)
GFR calc non Af Amer: >60 mL/min (ref >60)
Glucose, Bld: 118 mg/dL — ABNORMAL HIGH (ref 70–99)
Potassium: 4 mmol/L (ref 3.5–5.1)
Sodium: 134 mmol/L — ABNORMAL LOW (ref 135–145)

## 2019-01-13 LAB — TROPONIN I (HIGH SENSITIVITY): Troponin I (High Sensitivity): 4 ng/L (ref <18)

## 2019-01-13 LAB — CBC
HCT: 39.5 % (ref 39.0–52.0)
Hemoglobin: 12.8 g/dL — ABNORMAL LOW (ref 13.0–17.0)
MCH: 29.9 pg (ref 26.0–34.0)
MCHC: 32.4 g/dL (ref 30.0–36.0)
MCV: 92.3 fL (ref 80.0–100.0)
Platelets: 316 10*3/uL (ref 150–400)
RBC: 4.28 MIL/uL (ref 4.22–5.81)
RDW: 14.1 % (ref 11.5–15.5)
WBC: 5.7 10*3/uL (ref 4.0–10.5)
nRBC: 0 % (ref 0.0–0.2)

## 2019-01-13 MED ORDER — METHOCARBAMOL 500 MG PO TABS: 500.0000 mg | ORAL_TABLET | Freq: Two times a day (BID) | ORAL | 0 refills | Status: DC | PRN

## 2019-01-13 NOTE — Discharge Instructions (Signed)
Your testing shows no signs of heart attack.  You may take Robaxin for a muscle relaxer and you may try either ibuprofen or aspirin to help with an anti-inflammatory.  This should gradually get better over the next couple of days however it if it gets worse or you develop more severe chest pain difficulty breathing or any worsening symptoms please return to the emergency department.  I would advise you to follow-up with a cardiologist in the outpatient setting, their phone number is attached and you should follow-up in the next week, call today for an appointment, if you smoke cigarettes please stop immediately.

## 2019-01-13 NOTE — ED Triage Notes (Signed)
Pt presents to ED with complaints of left sided chest and rib pain started yesterday. Pt states feels sharp. Pt states he had just got off work when it started. Pt denies SOB, nausea, dizziness.

## 2019-01-13 NOTE — ED Provider Notes (Signed)
[facility] EMERGENCY DEPARTMENT Provider Note   CSN: [id number] Arrival date & time: 01/13/19  1827     History   Chief Complaint Chief Complaint  Patient presents with  . Chest Pain    HPI Leroy [name] is a 53 y.o. male.     HPI  This patient is a 53 year old male, he has a known history of hypertension, he also smokes and drinks.  The patient reports that he has had some pain in the left side of his chest that started yesterday, it has been intermittent, frequent, not associated with exertion, symptoms are persistent, nothing seems to make this better however it does get worse when he raises his left arm and tilts to the right side.  No coughing, no shortness of breath, no other risk factors for pulmonary embolism including travel trauma immobilization surgery or hypercoagulable state.  He has no history of cocaine use.  No swelling of the legs.  No medications given prior to arrival.  He has never had chest pain like this before.  He describes it as a dull intermittent pain  Past Medical History:  Diagnosis Date  . Hypercholesteremia   . Hypertension     Patient Active Problem List   Diagnosis Date Noted  . Fractured lateral malleolus 11/17/2013  . Orthostatic hypotension 07/26/2012  . ALCOHOL ABUSE 10/15/2008  . GI BLEED 10/15/2008    History reviewed. No pertinent surgical history.      Home Medications    Prior to Admission medications   Medication Sig Start Date End Date Taking? Authorizing Provider  methocarbamol (ROBAXIN) 500 MG tablet Take 1 tablet (500 mg total) by mouth 2 (two) times daily as needed for muscle spasms. 01/13/19   Noemi Chapel, MD    Family History No family history on file.  Social History Social History   Tobacco Use  . Smoking status: Current Every Day Smoker    Packs/day: 0.50    Types: Cigarettes  . Smokeless tobacco: Never Used  Substance Use Topics  . Alcohol use: Yes    Alcohol/week: 2.0 standard drinks    Types:  2 Cans of beer per week  . Drug use: Yes    Types: Marijuana     Allergies   Patient has no known allergies.   Review of Systems Review of Systems  All other systems reviewed and are negative.    Physical Exam Updated Vital Signs BP 126/89 (BP Location: Right Arm)   Pulse (!) 106   Temp 99.6 F (37.6 C) (Oral)   Resp 16   Ht 1.905 m (6\' 3" )   Wt 90.7 kg   SpO2 97%   BMI 25.00 kg/m   Physical Exam Vitals signs and nursing note reviewed.  Constitutional:      General: He is not in acute distress.    Appearance: He is well-developed.  HENT:     Head: Normocephalic and atraumatic.     Mouth/Throat:     Pharynx: No oropharyngeal exudate.  Eyes:     General: No scleral icterus.       Right eye: No discharge.        Left eye: No discharge.     Conjunctiva/sclera: Conjunctivae normal.     Pupils: Pupils are equal, round, and reactive to light.  Neck:     Musculoskeletal: Normal range of motion and neck supple.     Thyroid: No thyromegaly.     Vascular: No JVD.  Cardiovascular:     Rate and  Rhythm: Normal rate and regular rhythm.     Heart sounds: Normal heart sounds. No murmur. No friction rub. No gallop.   Pulmonary:     Effort: Pulmonary effort is normal. No respiratory distress.     Breath sounds: Normal breath sounds. No wheezing or rales.  Abdominal:     General: Bowel sounds are normal. There is no distension.     Palpations: Abdomen is soft. There is no mass.     Tenderness: There is no abdominal tenderness.  Musculoskeletal: Normal range of motion.        General: No tenderness.  Lymphadenopathy:     Cervical: No cervical adenopathy.  Skin:    General: Skin is warm and dry.     Findings: No erythema or rash.  Neurological:     Mental Status: He is alert.     Coordination: Coordination normal.  Psychiatric:        Behavior: Behavior normal.      ED Treatments / Results  Labs (all labs ordered are listed, but only abnormal results are  displayed) Labs Reviewed  CBC - Abnormal; Notable for the following components:      Result Value   Hemoglobin 12.8 (*)    All other components within normal limits  BASIC METABOLIC PANEL - Abnormal; Notable for the following components:   Sodium 134 (*)    Glucose, Bld 118 (*)    All other components within normal limits  TROPONIN I (HIGH SENSITIVITY)    EKG ED ECG REPORT  I personally interpreted this EKG   Date: 01/13/2019   Rate: 99  Rhythm: normal sinus rhythm  QRS Axis: normal  Intervals: normal  ST/T Wave abnormalities: normal  Conduction Disutrbances:none  Narrative Interpretation:   Old EKG Reviewed: none available   Radiology Dg Chest 2 View  Result Date: 01/13/2019 CLINICAL DATA:  Chest pain EXAM: CHEST - 2 VIEW COMPARISON:  None. FINDINGS: There are near symmetric rounded bilateral densities in the lower lung zones measuring approximately 7 mm. There is no pneumothorax. No large pleural effusion. There is mild elevation of the right hemidiaphragm. There is some atelectasis at the left lung base. IMPRESSION: 1. No acute cardiopulmonary process identified. 2. Rounded densities bilaterally in the lower lung zones favored to represent nipple shadows. Follow-up with a nonemergent outpatient two-view chest x-ray with nipple markers is recommended for confirmation. Electronically Signed   By: Constance Holster M.D.   On: 01/13/2019 20:58    Procedures Procedures (including critical care time)  Medications Ordered in ED Medications - No data to display   Initial Impression / Assessment and Plan / ED Course  I have reviewed the triage vital signs and the nursing notes.  Pertinent labs & imaging results that were available during my care of the patient were reviewed by me and considered in my medical decision making (see chart for details).  Clinical Course as of Jan 13 2116  Mon Jan 13, 2019  2114 Labs show normal troponin, normal CBC and a normal metabolic panel.    [BM]  [id number] I have personally looked at the x-ray, I see no signs of pneumonia, infiltrate, pneumothorax or abnormal mediastinum, possible nipple shadows.  The patient was made aware, he is stable for discharge   [BM]    Clinical Course User Index [BM] Noemi Chapel, MD       The patient's EKG is unremarkable, he has normal sinus rhythm with a rate of 99, normal axis, intervals, ST segments and  T waves.  There is no signs of ischemia.  The patient's history does not reflect risk factors for pulmonary embolism, he does not have any shortness of breath fever coughing.  He has pain which seems to be reproducible with different positions of his thorax.  Will obtain an x-ray, labs, anticipate discharge if negative.  The patient is otherwise low to medium risk and can follow-up in the outpatient setting with a negative troponin.  Final Clinical Impressions(s) / ED Diagnoses   Final diagnoses:  Chest pain at rest    ED Discharge Orders         Ordered    methocarbamol (ROBAXIN) 500 MG tablet  2 times daily PRN     01/13/19 2115           Noemi Chapel, MD 01/13/19 2117

## 2019-10-05 ENCOUNTER — Encounter: Payer: Self-pay | Admitting: Internal Medicine

## 2019-10-08 DIAGNOSIS — K859 Acute pancreatitis without necrosis or infection, unspecified: Secondary | ICD-10-CM

## 2019-10-08 HISTORY — DX: Acute pancreatitis without necrosis or infection, unspecified: K85.90

## 2019-10-15 ENCOUNTER — Emergency Department (HOSPITAL_COMMUNITY): Payer: Self-pay

## 2019-10-15 ENCOUNTER — Inpatient Hospital Stay (HOSPITAL_COMMUNITY)
Admission: EM | Admit: 2019-10-15 | Discharge: 2019-10-20 | DRG: 439 | Disposition: A | Discharge status: Home / Self Care | Payer: Self-pay | Attending: Internal Medicine | Admitting: Internal Medicine

## 2019-10-15 ENCOUNTER — Encounter (HOSPITAL_COMMUNITY): Payer: Self-pay | Admitting: *Deleted

## 2019-10-15 ENCOUNTER — Other Ambulatory Visit: Payer: Self-pay

## 2019-10-15 DIAGNOSIS — E871 Hypo-osmolality and hyponatremia: Secondary | ICD-10-CM | POA: Diagnosis present

## 2019-10-15 DIAGNOSIS — R109 Unspecified abdominal pain: Secondary | ICD-10-CM

## 2019-10-15 DIAGNOSIS — K5900 Constipation, unspecified: Secondary | ICD-10-CM | POA: Diagnosis present

## 2019-10-15 DIAGNOSIS — J910 Malignant pleural effusion: Secondary | ICD-10-CM | POA: Insufficient documentation

## 2019-10-15 DIAGNOSIS — B179 Acute viral hepatitis, unspecified: Secondary | ICD-10-CM | POA: Diagnosis present

## 2019-10-15 DIAGNOSIS — K8020 Calculus of gallbladder without cholecystitis without obstruction: Secondary | ICD-10-CM

## 2019-10-15 DIAGNOSIS — I10 Essential (primary) hypertension: Secondary | ICD-10-CM

## 2019-10-15 DIAGNOSIS — R7401 Elevation of levels of liver transaminase levels: Secondary | ICD-10-CM

## 2019-10-15 DIAGNOSIS — K852 Alcohol induced acute pancreatitis without necrosis or infection: Principal | ICD-10-CM

## 2019-10-15 DIAGNOSIS — E876 Hypokalemia: Secondary | ICD-10-CM | POA: Diagnosis not present

## 2019-10-15 DIAGNOSIS — E7800 Pure hypercholesterolemia, unspecified: Secondary | ICD-10-CM | POA: Diagnosis present

## 2019-10-15 DIAGNOSIS — Z8719 Personal history of other diseases of the digestive system: Secondary | ICD-10-CM

## 2019-10-15 DIAGNOSIS — E785 Hyperlipidemia, unspecified: Secondary | ICD-10-CM

## 2019-10-15 DIAGNOSIS — K859 Acute pancreatitis without necrosis or infection, unspecified: Secondary | ICD-10-CM | POA: Diagnosis present

## 2019-10-15 DIAGNOSIS — Z79899 Other long term (current) drug therapy: Secondary | ICD-10-CM

## 2019-10-15 DIAGNOSIS — R1110 Vomiting, unspecified: Secondary | ICD-10-CM

## 2019-10-15 DIAGNOSIS — Z20822 Contact with and (suspected) exposure to covid-19: Secondary | ICD-10-CM | POA: Diagnosis present

## 2019-10-15 DIAGNOSIS — F1010 Alcohol abuse, uncomplicated: Secondary | ICD-10-CM | POA: Diagnosis present

## 2019-10-15 DIAGNOSIS — F1020 Alcohol dependence, uncomplicated: Secondary | ICD-10-CM | POA: Diagnosis present

## 2019-10-15 DIAGNOSIS — K760 Fatty (change of) liver, not elsewhere classified: Secondary | ICD-10-CM | POA: Diagnosis present

## 2019-10-15 DIAGNOSIS — F17210 Nicotine dependence, cigarettes, uncomplicated: Secondary | ICD-10-CM | POA: Diagnosis present

## 2019-10-15 LAB — COMPREHENSIVE METABOLIC PANEL
ALT: 33 U/L (ref 0–44)
AST: 44 U/L — ABNORMAL HIGH (ref 15–41)
Albumin: 4.6 g/dL (ref 3.5–5.0)
Alkaline Phosphatase: 100 U/L (ref 38–126)
Anion gap: 13 (ref 5–15)
BUN: 12 mg/dL (ref 6–20)
CO2: 24 mmol/L (ref 22–32)
Calcium: 9.7 mg/dL (ref 8.9–10.3)
Chloride: 97 mmol/L — ABNORMAL LOW (ref 98–111)
Creatinine, Ser: 0.95 mg/dL (ref 0.61–1.24)
GFR calc Af Amer: >60 mL/min (ref >60)
GFR calc non Af Amer: >60 mL/min (ref >60)
Glucose, Bld: 131 mg/dL — ABNORMAL HIGH (ref 70–99)
Potassium: 3.8 mmol/L (ref 3.5–5.1)
Sodium: 134 mmol/L — ABNORMAL LOW (ref 135–145)
Total Bilirubin: 1.6 mg/dL — ABNORMAL HIGH (ref 0.3–1.2)
Total Protein: 9.5 g/dL — ABNORMAL HIGH (ref 6.5–8.1)

## 2019-10-15 LAB — URINALYSIS, ROUTINE W REFLEX MICROSCOPIC
Bacteria, UA: NONE SEEN
Bilirubin Urine: NEGATIVE
Glucose, UA: NEGATIVE mg/dL
Ketones, ur: 5 mg/dL — AB
Leukocytes,Ua: NEGATIVE
Nitrite: NEGATIVE
Protein, ur: >=300 mg/dL — AB
Specific Gravity, Urine: 1.01 (ref 1.005–1.030)
pH: 6 (ref 5.0–8.0)

## 2019-10-15 LAB — LIPASE, BLOOD: Lipase: 114 U/L — ABNORMAL HIGH (ref 11–51)

## 2019-10-15 LAB — CBC
HCT: 47.5 % (ref 39.0–52.0)
Hemoglobin: 15.8 g/dL (ref 13.0–17.0)
MCH: 30.8 pg (ref 26.0–34.0)
MCHC: 33.3 g/dL (ref 30.0–36.0)
MCV: 92.6 fL (ref 80.0–100.0)
Platelets: 337 10*3/uL (ref 150–400)
RBC: 5.13 MIL/uL (ref 4.22–5.81)
RDW: 14.5 % (ref 11.5–15.5)
WBC: 11.8 10*3/uL — ABNORMAL HIGH (ref 4.0–10.5)
nRBC: 0 % (ref 0.0–0.2)

## 2019-10-15 LAB — SARS CORONAVIRUS 2 BY RT PCR (HOSPITAL ORDER, PERFORMED IN ~~LOC~~ HOSPITAL LAB): SARS Coronavirus 2: NEGATIVE

## 2019-10-15 MED ORDER — PANTOPRAZOLE SODIUM 40 MG IV SOLR
40.0000 mg | Freq: Once | INTRAVENOUS | Status: AC
  Administered 2019-10-15: 40 mg via INTRAVENOUS
  Filled 2019-10-15: qty 40

## 2019-10-15 MED ORDER — IOHEXOL 300 MG/ML  SOLN
100.0000 mL | Freq: Once | INTRAMUSCULAR | Status: AC | PRN
  Administered 2019-10-15: 100 mL via INTRAVENOUS

## 2019-10-15 MED ORDER — ONDANSETRON HCL 4 MG/2ML IJ SOLN
4.0000 mg | Freq: Once | INTRAMUSCULAR | Status: AC
  Administered 2019-10-15: 4 mg via INTRAVENOUS
  Filled 2019-10-15: qty 2

## 2019-10-15 MED ORDER — SODIUM CHLORIDE 0.9 % IV BOLUS
1000.0000 mL | Freq: Once | INTRAVENOUS | Status: AC
  Administered 2019-10-15: 1000 mL via INTRAVENOUS

## 2019-10-15 MED ORDER — ENOXAPARIN SODIUM 40 MG/0.4ML ~~LOC~~ SOLN
40.0000 mg | SUBCUTANEOUS | Status: DC
  Administered 2019-10-15 – 2019-10-19 (×4): 40 mg via SUBCUTANEOUS
  Filled 2019-10-15 (×5): qty 0.4

## 2019-10-15 MED ORDER — MORPHINE SULFATE (PF) 4 MG/ML IV SOLN
4.0000 mg | Freq: Once | INTRAVENOUS | Status: AC
  Administered 2019-10-15: 4 mg via INTRAVENOUS
  Filled 2019-10-15: qty 1

## 2019-10-15 NOTE — ED Notes (Signed)
Patient asked about morning medicine since he left his home. Reminded patient that he will receive those medications upon his admitting orders.

## 2019-10-15 NOTE — H&P (Signed)
History and Physical  Leroy [name] GLO:[id number] DOB: April 30, 1965 DOA: 10/15/2019  Referring physician: Margarita Mail, PA-C PCP: Lucia Gaskins, MD  Patient coming from: Home  Chief Complaint: Abdominal pain  HPI: Leroy [name] is a 54 y.o. male with medical history significant for alcohol abuse, previous GI bleed, hypertension, hyperlipidemia who presents to the emergency department due to 1 day onset of abdominal pain.  Patient complained of upper quadrant abdominal pain which was intermittent, but became more consistent as the day progressed yesterday, it was associated with nonbloody vomitus and patient feels that the abdomen is more distended.  He endorsed drinking 2-12 ounce beer daily and denies any withdrawal symptoms when he does not drink.  Last alcohol drink was on Monday (8/6).  There was no alleviating or aggravating factor.  He denies use of NSAIDs, diarrhea, constipation, chest pain or shortness of breath.  Patient also denies blood in stool.  ED Course:  In the emergency department, was febrile with a temperature 100.39F, was also tachycardic and tachypneic.  Work-up in the ED showed mild hyponatremia, AST 44, total bilirubin 1.6, lipase 114.  SARS coronavirus 2 was negative.  CT abdomen and pelvis showed stranding around the pancreatic head and uncinate process continuing into the right pararenal space compatible with acute pancreatitis.  IV hydration was started, patient was also started on IV morphine and IV Protonix was given.  Hospitalist was asked to admit patient for further evaluation and management.  Review of Systems: Constitutional: Negative for chills and fever.  HENT: Negative for ear pain and sore throat.   Eyes: Negative for pain and visual disturbance.  Respiratory: Negative for cough, chest tightness and shortness of breath.   Cardiovascular: Negative for chest pain and palpitations.  Gastrointestinal: Positive for abdominal pain and vomiting.    Endocrine: Negative for polyphagia and polyuria.  Genitourinary: Negative for decreased urine volume, dysuria, enuresis Musculoskeletal: Negative for arthralgias and back pain.  Skin: Negative for color change and rash.  Allergic/Immunologic: Negative for immunocompromised state.  Neurological: Negative for tremors, syncope, speech difficulty, weakness, light-headedness and headaches.  Hematological: Does not bruise/bleed easily.    Past Medical History:  Diagnosis Date  . Hypercholesteremia   . Hypertension    History reviewed. No pertinent surgical history.  Social History:  reports that he has been smoking cigarettes. He has been smoking about 0.50 packs per day. He has never used smokeless tobacco. He reports current alcohol use of about 2.0 standard drinks of alcohol per week. He reports current drug use. Drug: Marijuana.   No Known Allergies  History reviewed. No pertinent family history.   Prior to Admission medications   Medication Sig Start Date End Date Taking? Authorizing Provider  amLODipine (NORVASC) 10 MG tablet Take 10 mg by mouth daily. 07/25/19  Yes [provider]  simvastatin (ZOCOR) 40 MG tablet Take 40 mg by mouth daily. 08/21/19  Yes [provider]    Physical Exam: BP 130/84 (BP Location: Left Arm)   Pulse (!) 106   Temp 100.2 F (37.9 C) (Oral)   Resp 18   Ht 6\' 3"  (1.905 m)   Wt 86.2 kg   SpO2 97%   BMI 23.75 kg/m   . General: 54 y.o. year-old male well developed well nourished in no acute distress.  Alert and oriented x3. Marland Kitchen HEENT: NCAT, EOMI . Neck: Supple, trachea medial . Cardiovascular: Regular rate and rhythm with no rubs or gallops.  No thyromegaly or JVD noted.  No lower extremity edema.  2/4 pulses in all 4 extremities. Marland Kitchen Respiratory: Clear to auscultation with no wheezes or rales. Good inspiratory effort. . Abdomen: Soft tender to palpation with no guarding.Normal bowel sounds x4 quadrants. . Muskuloskeletal: No  cyanosis, clubbing or edema noted bilaterally . Neuro: CN II-XII intact, strength, sensation, reflexes . Skin: No ulcerative lesions noted or rashes . Psychiatry: Judgement and insight appear normal. Mood is appropriate for condition and setting          Labs on Admission:  Basic Metabolic Panel: Recent Labs  Lab 10/15/19 1713  NA 134*  K 3.8  CL 97*  CO2 24  GLUCOSE 131*  BUN 12  CREATININE 0.95  CALCIUM 9.7   Liver Function Tests: Recent Labs  Lab 10/15/19 1713  AST 44*  ALT 33  ALKPHOS 100  BILITOT 1.6*  PROT 9.5*  ALBUMIN 4.6   Recent Labs  Lab 10/15/19 1713  LIPASE 114*   No results for input(s): AMMONIA in the last 168 hours. CBC: Recent Labs  Lab 10/15/19 1713  WBC 11.8*  HGB 15.8  HCT 47.5  MCV 92.6  PLT 337   Cardiac Enzymes: No results for input(s): CKTOTAL, CKMB, CKMBINDEX, TROPONINI in the last 168 hours.  BNP (last 3 results) No results for input(s): BNP in the last 8760 hours.  ProBNP (last 3 results) No results for input(s): PROBNP in the last 8760 hours.  CBG: No results for input(s): GLUCAP in the last 168 hours.  Radiological Exams on Admission: CT ABDOMEN PELVIS W CONTRAST  Result Date: 10/15/2019 CLINICAL DATA:  Nausea, vomiting, abdominal pain EXAM: CT ABDOMEN AND PELVIS WITH CONTRAST TECHNIQUE: Multidetector CT imaging of the abdomen and pelvis was performed using the standard protocol following bolus administration of intravenous contrast. CONTRAST:  171mL OMNIPAQUE IOHEXOL 300 MG/ML  SOLN COMPARISON:  None. FINDINGS: Lower chest: No acute abnormality. Hepatobiliary: Diffuse fatty infiltration. Calcification within the right hepatic lobe appears benign. No suspicious focal hepatic abnormality. Gallbladder unremarkable. Pancreas: There is inflammation/stranding around the pancreatic head and uncinate process compatible with acute pancreatitis. Fluid tracks in the anterior pararenal space and around the 2nd and 3rd portions of the  duodenum. Spleen: No focal abnormality.  Normal size. Adrenals/Urinary Tract: No adrenal abnormality. No focal renal abnormality. No stones or hydronephrosis. Urinary bladder is unremarkable. Stomach/Bowel: Normal appendix. Stomach, large and small bowel grossly unremarkable. Vascular/Lymphatic: Scattered aortic atherosclerosis. No evidence of aneurysm or adenopathy. Reproductive: Prostate enlargement with central calcifications. Other: No free fluid or free air. Musculoskeletal: No acute bony abnormality. IMPRESSION: Stranding around the pancreatic head and uncinate process continuing into the right pararenal space compatible with acute pancreatitis. Aortic atherosclerosis. Electronically Signed   By: Rolm Baptise M.D.   On: 10/15/2019 19:32    EKG: I independently viewed the EKG done and my findings are as followed: No EKG was done in the ED  Assessment/Plan Present on Admission: . Acute pancreatitis . Alcohol abuse  Active Problems:   Alcohol abuse   Acute pancreatitis   History of GI bleed   Essential hypertension   Hyperlipidemia  Abdominal pain and vomiting possibly secondary to acute pancreatitis BISAP Score = 1 point  Lipase level was normal fall CT abdomen pelvis showed acute pancreatitis Continue IV Zofran p.r.n Continue IV morphine 2 mg every 4 hours p.r.n for pain Continue Protonix 40 mg daily Continue IV LR at 165ml/Hr Continue full liquid diet with plan to advance diet as tolerated RUQ U/S in the morning to investigate biliary etiology (gallstone and bile  duct dilatation)  SIRS positive with no convincing evidence of sepsis Patient was initially febrile and tachypneic. He was also tachycardic This may be due to acute pancreatitis. Patient does not appear sepsis clinically Continue management as described above  Mildly elevated AST/total bilirubin AST 44, this may be a reactive process on the liver considering patient's history of alcohol abuse  Right upper quadrant  ultrasound in the morning  History of GI bleed Patient denies melena/hematochezia Continue Protonix as described above  Essential hypertension Continue home amlodipine  Hyperlipidemia Zocor will be temporarily held due to elevated AST  Alcohol abuse Patient denies history of alcohol withdrawal Continue to monitor patient for alcohol withdrawal symptoms and treat accordingly  DVT prophylaxis: Lovenox  Code Status: Full code  Family Communication: None at bedside  Disposition Plan:  Patient is from:                        home Anticipated DC to:                   Home Anticipated DC date:               2-3 days Anticipated DC barriers:          Unstable to discharge at this time due to acute hepatitis requiring inpatient treatment   Consults called: None  Admission status: Inpatient    Bernadette Hoit MD [facility]  If 7PM-7AM, please contact night-coverage [email or web address] Password [facility]  10/15/2019, 11:58 PM

## 2019-10-15 NOTE — ED Provider Notes (Signed)
[facility] EMERGENCY DEPARTMENT Provider Note   CSN: [id number] Arrival date & time: 10/15/19  1358     History Chief Complaint  Patient presents with  . Abdominal Pain    Leroy [name] is a 54 y.o. male with a past medical history of alcohol abuse, previous GI bleed, history of hernia repair who presents emergency department with chief complaint of epigastric abdominal pain.  Patient states that he has had some on and off epigastric abdominal pain for the past 2 weeks however in the past 24 hours it has become constant, intense, severe.  Pain does not radiate.  Nothing seems to make it worse or better.  He has had several episodes of nonbloody nonbilious vomitus.  He feels like his abdomen is more distended than usual.  He has had no diarrhea or constipation.  He admits to drinking 212 ounce beers daily.  He denies use of NSAIDs.  He denies melena or hematochezia.  He has had 2 doses of Covid vaccination.  HPI     Past Medical History:  Diagnosis Date  . Hypercholesteremia   . Hypertension     Patient Active Problem List   Diagnosis Date Noted  . Fractured lateral malleolus 11/17/2013  . Orthostatic hypotension 07/26/2012  . ALCOHOL ABUSE 10/15/2008  . GI BLEED 10/15/2008    History reviewed. No pertinent surgical history.     History reviewed. No pertinent family history.  Social History   Tobacco Use  . Smoking status: Current Every Day Smoker    Packs/day: 0.50    Types: Cigarettes  . Smokeless tobacco: Never Used  Substance Use Topics  . Alcohol use: Yes    Alcohol/week: 2.0 standard drinks    Types: 2 Cans of beer per week  . Drug use: Yes    Types: Marijuana    Home Medications Prior to Admission medications   Medication Sig Start Date End Date Taking? Authorizing Provider  amLODipine (NORVASC) 10 MG tablet Take 10 mg by mouth daily. 07/25/19  Yes [provider]  simvastatin (ZOCOR) 40 MG tablet Take 40 mg by mouth daily. 08/21/19  Yes  [provider]    Allergies    Patient has no known allergies.  Review of Systems   Review of Systems Ten systems reviewed and are negative for acute change, except as noted in the HPI.  Physical Exam Updated Vital Signs BP (!) 132/94 (BP Location: Left Arm)   Pulse (!) 122   Temp 100.2 F (37.9 C) (Oral)   Resp 20   Ht 6\' 3"  (1.905 m)   Wt 86.2 kg   SpO2 98%   BMI 23.75 kg/m   Physical Exam Vitals and nursing note reviewed.  Constitutional:      General: He is not in acute distress.    Appearance: He is well-developed. He is not diaphoretic.  HENT:     Head: Normocephalic and atraumatic.  Eyes:     General: No scleral icterus.    Conjunctiva/sclera: Conjunctivae normal.  Cardiovascular:     Rate and Rhythm: Normal rate and regular rhythm.     Heart sounds: Normal heart sounds.  Pulmonary:     Effort: Pulmonary effort is normal. No respiratory distress.     Breath sounds: Normal breath sounds.  Abdominal:     General: There is distension.     Palpations: Abdomen is soft.     Tenderness: There is abdominal tenderness in the right upper quadrant, epigastric area and left upper quadrant.  Musculoskeletal:     Cervical back: Normal range of motion and neck supple.  Skin:    General: Skin is warm and dry.  Neurological:     Mental Status: He is alert.  Psychiatric:        Behavior: Behavior normal.     ED Results / Procedures / Treatments   Labs (all labs ordered are listed, but only abnormal results are displayed) Labs Reviewed  LIPASE, BLOOD - Abnormal; Notable for the following components:      Result Value   Lipase 114 (*)    All other components within normal limits  COMPREHENSIVE METABOLIC PANEL - Abnormal; Notable for the following components:   Sodium 134 (*)    Chloride 97 (*)    Glucose, Bld 131 (*)    Total Protein 9.5 (*)    AST 44 (*)    Total Bilirubin 1.6 (*)    All other components within normal limits  CBC - Abnormal; Notable  for the following components:   WBC 11.8 (*)    All other components within normal limits  URINALYSIS, ROUTINE W REFLEX MICROSCOPIC    EKG None  Radiology No results found.  Procedures Procedures (including critical care time)  Medications Ordered in ED Medications  morphine 4 MG/ML injection 4 mg (has no administration in time range)  ondansetron (ZOFRAN) injection 4 mg (has no administration in time range)    ED Course  I have reviewed the triage vital signs and the nursing notes.  Pertinent labs & imaging results that were available during my care of the patient were reviewed by me and considered in my medical decision making (see chart for details).    MDM Rules/Calculators/A&P                           NF:[id number] pain VS: BP (!) 141/93 (BP Location: Left Arm)   Pulse (!) 122   Temp (!) 100.8 F (38.2 C) (Oral)   Resp 18   Ht 6\' 3"  (1.905 m)   Wt 86.2 kg   SpO2 99%   BMI 23.75 kg/m   QI:ONGEXBM is gathered by patient  And EMR . Previous records obtained and reviewed. DDX:The patient's complaint of abdominal pain involves an extensive number of diagnostic and treatment options, and is a complaint that carries with it a high risk of complications, morbidity, and potential mortality. Given the large differential diagnosis, medical decision making is of high complexity.Differential diagnosis of epigastric pain includes: Functional or nonulcer dyspepsia, PUD, GERD, Gastritis, (NSAIDs, alcohol, stress, H. pylori, pernicious anemia), pancreatitis or pancreatic cancer, overeating indigestion (high-fat foods, coffee), drugs (aspirin, antibiotics (eg, macrolides, metronidazole), corticosteroids, digoxin, narcotics, theophylline), gastroparesis, lactose intolerance, malabsorption gastric cancer, parasitic infection, (Giardia, Strongyloides, Ascaris) cholelithiasis, choledocholithiasis, or cholangitis, ACS, pericarditis, pneumonia, abdominal hernia, pregnancy, intestinal  ischemia, esophageal rupture, gastric volvulus, hepatitis.   Labs: I ordered reviewed and interpreted labs which include labs which include urine which is without significant abnormality, Covid test negative.  CBC with slightly elevated white count.  CMP with mildly elevated liver enzymes.  No other significant abnormalities.  Lipase is slightly elevated.  Imaging: I ordered and reviewed images which included CT abdomen pelvis. I independently visualized and interpreted all imaging. Significant findings include inflammatory stranding around the pancreas consistent with acute pancreatitis. EKG: Consults: Dr. Josephine Cables MDM: Patient with acute pancreatitis.  His pain is pretty severe.  He is not actively vomiting.  He has a history of  alcohol abuse.  I have started fluids on the patient.  He will need admission. Patient disposition: The patient appears reasonably stabilized for admission considering the current resources, flow, and capabilities available in the ED at this time, and I doubt any other [facility] requiring further screening and/or treatment in the ED prior to admission.   Leroy [name] was evaluated in Emergency Department on 10/17/2019 for the symptoms described in the history of present illness. He was evaluated in the context of the global COVID-19 pandemic, which necessitated consideration that the patient might be at risk for infection with the SARS-CoV-2 virus that causes COVID-19. Institutional protocols and algorithms that pertain to the evaluation of patients at risk for COVID-19 are in a state of rapid change based on information released by regulatory bodies including the CDC and federal and state organizations. These policies and algorithms were followed during the patient's care in the ED.       Final Clinical Impression(s) / ED Diagnoses Final diagnoses:  None    Rx / DC Orders ED Discharge Orders    None       Margarita Mail, PA-C 10/17/19 1539    Maudie Flakes, MD 10/20/19 1626

## 2019-10-15 NOTE — ED Triage Notes (Signed)
Pt with generalized abd pain with emesis off and on for 2 weeks.  LBM yesterday and normal per pt.

## 2019-10-16 ENCOUNTER — Inpatient Hospital Stay (HOSPITAL_COMMUNITY): Payer: Self-pay

## 2019-10-16 DIAGNOSIS — R109 Unspecified abdominal pain: Secondary | ICD-10-CM

## 2019-10-16 DIAGNOSIS — R1110 Vomiting, unspecified: Secondary | ICD-10-CM

## 2019-10-16 DIAGNOSIS — R7401 Elevation of levels of liver transaminase levels: Secondary | ICD-10-CM

## 2019-10-16 LAB — COMPREHENSIVE METABOLIC PANEL
ALT: 24 U/L (ref 0–44)
AST: 26 U/L (ref 15–41)
Albumin: 3.6 g/dL (ref 3.5–5.0)
Alkaline Phosphatase: 74 U/L (ref 38–126)
Anion gap: 10 (ref 5–15)
BUN: 10 mg/dL (ref 6–20)
CO2: 23 mmol/L (ref 22–32)
Calcium: 8.7 mg/dL — ABNORMAL LOW (ref 8.9–10.3)
Chloride: 100 mmol/L (ref 98–111)
Creatinine, Ser: 0.73 mg/dL (ref 0.61–1.24)
GFR calc Af Amer: >60 mL/min (ref >60)
GFR calc non Af Amer: >60 mL/min (ref >60)
Glucose, Bld: 130 mg/dL — ABNORMAL HIGH (ref 70–99)
Potassium: 3.8 mmol/L (ref 3.5–5.1)
Sodium: 133 mmol/L — ABNORMAL LOW (ref 135–145)
Total Bilirubin: 1.2 mg/dL (ref 0.3–1.2)
Total Protein: 7.7 g/dL (ref 6.5–8.1)

## 2019-10-16 LAB — LIPASE, BLOOD: Lipase: 71 U/L — ABNORMAL HIGH (ref 11–51)

## 2019-10-16 LAB — CBC
HCT: 39.5 % (ref 39.0–52.0)
Hemoglobin: 13.2 g/dL (ref 13.0–17.0)
MCH: 31.1 pg (ref 26.0–34.0)
MCHC: 33.4 g/dL (ref 30.0–36.0)
MCV: 93.2 fL (ref 80.0–100.0)
Platelets: 261 10*3/uL (ref 150–400)
RBC: 4.24 MIL/uL (ref 4.22–5.81)
RDW: 14.5 % (ref 11.5–15.5)
WBC: 13.7 10*3/uL — ABNORMAL HIGH (ref 4.0–10.5)
nRBC: 0 % (ref 0.0–0.2)

## 2019-10-16 LAB — PROTIME-INR
INR: 1.2 (ref 0.8–1.2)
Prothrombin Time: 14.6 seconds (ref 11.4–15.2)

## 2019-10-16 LAB — APTT: aPTT: 46 seconds — ABNORMAL HIGH (ref 24–36)

## 2019-10-16 LAB — HIV ANTIBODY (ROUTINE TESTING W REFLEX): HIV Screen 4th Generation wRfx: NONREACTIVE

## 2019-10-16 MED ORDER — SODIUM CHLORIDE 0.9 % IV BOLUS
500.0000 mL | Freq: Once | INTRAVENOUS | Status: AC
  Administered 2019-10-16: 500 mL via INTRAVENOUS

## 2019-10-16 MED ORDER — PANTOPRAZOLE SODIUM 40 MG PO TBEC
40.0000 mg | DELAYED_RELEASE_TABLET | Freq: Every day | ORAL | Status: DC
  Administered 2019-10-16: 40 mg via ORAL
  Filled 2019-10-16: qty 1

## 2019-10-16 MED ORDER — MORPHINE SULFATE (PF) 2 MG/ML IV SOLN
2.0000 mg | INTRAVENOUS | Status: DC | PRN
  Administered 2019-10-16 – 2019-10-17 (×4): 2 mg via INTRAVENOUS
  Filled 2019-10-16 (×4): qty 1

## 2019-10-16 MED ORDER — PANTOPRAZOLE SODIUM 40 MG IV SOLR
40.0000 mg | INTRAVENOUS | Status: DC
  Administered 2019-10-16 – 2019-10-20 (×5): 40 mg via INTRAVENOUS
  Filled 2019-10-16 (×6): qty 40

## 2019-10-16 MED ORDER — ACETAMINOPHEN 325 MG PO TABS
650.0000 mg | ORAL_TABLET | Freq: Once | ORAL | Status: AC
  Administered 2019-10-16: 650 mg via ORAL
  Filled 2019-10-16: qty 2

## 2019-10-16 MED ORDER — ONDANSETRON HCL 4 MG/2ML IJ SOLN
4.0000 mg | Freq: Four times a day (QID) | INTRAMUSCULAR | Status: DC | PRN
  Administered 2019-10-16: 4 mg via INTRAVENOUS
  Filled 2019-10-16: qty 2

## 2019-10-16 MED ORDER — AMLODIPINE BESYLATE 5 MG PO TABS
10.0000 mg | ORAL_TABLET | Freq: Every day | ORAL | Status: DC
  Administered 2019-10-16 – 2019-10-20 (×5): 10 mg via ORAL
  Filled 2019-10-16 (×6): qty 2

## 2019-10-16 MED ORDER — ACETAMINOPHEN 325 MG PO TABS: 650.0000 mg | ORAL_TABLET | Freq: Four times a day (QID) | ORAL | Status: DC | PRN

## 2019-10-16 MED ORDER — LACTATED RINGERS IV SOLN: Freq: Once | INTRAVENOUS | Status: AC

## 2019-10-16 NOTE — ED Notes (Signed)
Pt resting quietly.  C/O abdominal pain .  Rates 10/10.   Pain medication given.  Also c/o some nausea.  zofran given.  No visible distress.

## 2019-10-16 NOTE — Progress Notes (Addendum)
PROGRESS NOTE  Leroy [name]  DOB: [birth year]  PCP: Lucia Gaskins, MD HAL:[id number]  DOA: 10/15/2019  LOS: 1 day   Chief Complaint  Patient presents with  . Abdominal Pain    Brief narrative: Leroy [name] is a 54 y.o. male with PMH of chronic alcohol abuse, history of GI bleeding, hypertension, hyperlipidemia. Patient presented to the ED on 10/15/2019 with complaint of progressively worsening nausea, vomiting, abdominal pain, distention for 3 to 4 days. He drinks beer and hard liquor on a regular basis.  He says he would not be able to quantify but says 'it is at a problem level.'   Last alcohol drink was on Monday (8/6).    In the ED, he had a temperature 100.5, tachycardic to 135, tachypneic, oxygen saturation maintained on room air, blood pressure 160/91 Labs showed lipase level elevated to 114. CT abdomen and pelvis showed stranding around the pancreatic head and uncinate process continuing into the right pararenal space compatible with acute pancreatitis.  Patient was started on conservative management for acute pancreatitis and admitted to hospital service for further evaluation management.  Subjective: Patient was seen and examined this morning in the ED. Continues to have abdominal pain.  Assessment/Plan: Acute pancreatitis -likely alcoholic -Presented with worsening abdominal pain, distention, nausea vomiting in the setting of chronic alcoholism. -Lipase level elevated.  CT scan with evidence of acute pancreatitis. -Right upper quadrant ultrasound did not show any biliary stone. -Currently on conservative management with IV fluids, pain control.   -Continue LR at 125 mill per hour, IV morphine 2 mg every 4 hours as needed, IV Zofran as needed, IV Protonix -I would switch him back from full liquid to n.p.o. status next 24 hours. -Repeat lipase level in the morning.  SIRS positive with no convincing evidence of sepsis -Febrile, tachypneic, tachycardic.  Likely  secondary to acute pancreatitis and related pain. -No other evidence of infection. -Continue to monitor.  Chronic alcoholism High risk of alcohol withdrawal symptoms -Counseled to quit alcohol. -Watch out for withdrawal symptoms.  Elevated liver enzymes Hepatic steatosis -Secondary to alcohol use. -Enzymes seem to have normalized this morning Recent Labs  Lab 10/15/19 1713 10/16/19 0406  AST 44* 26  ALT 33 24  ALKPHOS 100 74  BILITOT 1.6* 1.2  PROT 9.5* 7.7  ALBUMIN 4.6 3.6   History of GI bleed -No evidence of active bleeding at this time. Continue Protonix  Essential hypertension Continue home amlodipine  Hyperlipidemia -Zocor on hold due to elevated AST  Mobility: Encourage ambulation Code Status:   Code Status: Full Code  Nutritional status: Body mass index is 23.75 kg/m.     Diet Order    None      DVT prophylaxis: enoxaparin (LOVENOX) injection 40 mg Start: 10/15/19 2200 SCDs Start: 10/15/19 2151   Antimicrobials:  None Fluid: LR@125  Consultants: None Family Communication:  None at bedside  Status is: Inpatient  Remains inpatient appropriate because: Requires IV fluid, IV pain medicine, inpatient monitoring  Dispo: The patient is from: Home              Anticipated d/c is to: Home              Anticipated d/c date is: 3 days              Patient currently is not medically stable to d/c.       Infusions:    Scheduled Meds: . enoxaparin (LOVENOX) injection  40 mg Subcutaneous Q24H  . pantoprazole (PROTONIX)  IV  40 mg Intravenous Q24H    Antimicrobials: Anti-infectives (From admission, onward)   None      PRN meds: morphine injection, ondansetron (ZOFRAN) IV   Objective: Vitals:   10/16/19 0739 10/16/19 1100  BP: (!) 131/96 136/88  Pulse: 100 97  Resp: 18 18  Temp: 99.3 F (37.4 C) (!) 100.8 F (38.2 C)  SpO2: 97% 97%   No intake or output data in the 24 hours ending 10/16/19 1155 Filed Weights   10/15/19 1413    Weight: 86.2 kg   Weight change:  Body mass index is 23.75 kg/m.   Physical Exam: General exam: Appears calm and comfortable.  Mild to moderate distress because of abdominal pain Skin: No rashes, lesions or ulcers. HEENT: Atraumatic, normocephalic, supple neck, no obvious bleeding Lungs: Clear to auscultation bilaterally CVS: Regular rate and rhythm, no murmur GI/Abd distended, diffusely tender, epigastric tenderness most pronounced, bowel sound present CNS: Alert, awake, oriented x3 Psychiatry: Depressed look Extremities: No pedal edema, no calf tenderness  Data Review: I have personally reviewed the laboratory data and studies available.  Recent Labs  Lab 10/15/19 1713 10/16/19 0406  WBC 11.8* 13.7*  HGB 15.8 13.2  HCT 47.5 39.5  MCV 92.6 93.2  PLT 337 261   Recent Labs  Lab 10/15/19 1713 10/16/19 0406  NA 134* 133*  K 3.8 3.8  CL 97* 100  CO2 24 23  GLUCOSE 131* 130*  BUN 12 10  CREATININE 0.95 0.73  CALCIUM 9.7 8.7*   Signed, Terrilee Croak, MD [facility] Pager: [phone] (Secure Chat preferred). 10/16/2019

## 2019-10-17 ENCOUNTER — Inpatient Hospital Stay (HOSPITAL_COMMUNITY): Payer: Self-pay

## 2019-10-17 ENCOUNTER — Encounter (HOSPITAL_COMMUNITY): Payer: Self-pay | Admitting: Internal Medicine

## 2019-10-17 LAB — CBC WITH DIFFERENTIAL/PLATELET
Abs Immature Granulocytes: 0.13 10*3/uL — ABNORMAL HIGH (ref 0.00–0.07)
Basophils Absolute: 0 10*3/uL (ref 0.0–0.1)
Basophils Relative: 0 %
Eosinophils Absolute: 0.1 10*3/uL (ref 0.0–0.5)
Eosinophils Relative: 0 %
HCT: 36.8 % — ABNORMAL LOW (ref 39.0–52.0)
Hemoglobin: 12.3 g/dL — ABNORMAL LOW (ref 13.0–17.0)
Immature Granulocytes: 1 %
Lymphocytes Relative: 3 %
Lymphs Abs: 0.5 10*3/uL — ABNORMAL LOW (ref 0.7–4.0)
MCH: 31.3 pg (ref 26.0–34.0)
MCHC: 33.4 g/dL (ref 30.0–36.0)
MCV: 93.6 fL (ref 80.0–100.0)
Monocytes Absolute: 1.3 10*3/uL — ABNORMAL HIGH (ref 0.1–1.0)
Monocytes Relative: 7 %
Neutro Abs: 15.9 10*3/uL — ABNORMAL HIGH (ref 1.7–7.7)
Neutrophils Relative %: 89 %
Platelets: 219 10*3/uL (ref 150–400)
RBC: 3.93 MIL/uL — ABNORMAL LOW (ref 4.22–5.81)
RDW: 14.2 % (ref 11.5–15.5)
WBC: 17.9 10*3/uL — ABNORMAL HIGH (ref 4.0–10.5)
nRBC: 0 % (ref 0.0–0.2)

## 2019-10-17 LAB — BASIC METABOLIC PANEL
Anion gap: 9 (ref 5–15)
BUN: 9 mg/dL (ref 6–20)
CO2: 23 mmol/L (ref 22–32)
Calcium: 8.8 mg/dL — ABNORMAL LOW (ref 8.9–10.3)
Chloride: 95 mmol/L — ABNORMAL LOW (ref 98–111)
Creatinine, Ser: 0.58 mg/dL — ABNORMAL LOW (ref 0.61–1.24)
GFR calc Af Amer: >60 mL/min (ref >60)
GFR calc non Af Amer: >60 mL/min (ref >60)
Glucose, Bld: 99 mg/dL (ref 70–99)
Potassium: 3.5 mmol/L (ref 3.5–5.1)
Sodium: 127 mmol/L — ABNORMAL LOW (ref 135–145)

## 2019-10-17 LAB — MAGNESIUM: Magnesium: 1.5 mg/dL — ABNORMAL LOW (ref 1.7–2.4)

## 2019-10-17 LAB — LIPASE, BLOOD: Lipase: 23 U/L (ref 11–51)

## 2019-10-17 MED ORDER — IOHEXOL 300 MG/ML  SOLN
100.0000 mL | Freq: Once | INTRAMUSCULAR | Status: AC | PRN
  Administered 2019-10-17: 100 mL via INTRAVENOUS

## 2019-10-17 MED ORDER — SODIUM CHLORIDE 0.9 % IV SOLN: INTRAVENOUS | Status: DC

## 2019-10-17 MED ORDER — IOHEXOL 9 MG/ML PO SOLN
ORAL | Status: AC
  Administered 2019-10-17: 500 mL
  Filled 2019-10-17: qty 1000

## 2019-10-17 MED ORDER — SODIUM CHLORIDE 0.9 % IV SOLN
2.0000 g | INTRAVENOUS | Status: DC
  Administered 2019-10-17 – 2019-10-19 (×3): 2 g via INTRAVENOUS
  Filled 2019-10-17 (×4): qty 20

## 2019-10-17 NOTE — Progress Notes (Signed)
   10/16/19 2009  Assess: MEWS Score  Temp (!) 101.1 F (38.4 C)  BP 132/89  Pulse Rate (!) 112  Resp 18  SpO2 100 %  O2 Device Room Air  Assess: MEWS Score  MEWS Temp 1  MEWS Systolic 0  MEWS Pulse 2  MEWS RR 0  MEWS LOC 0  MEWS Score 3  MEWS Score Color Yellow  Assess: if the MEWS score is Yellow or Red  Were vital signs taken at a resting state? Yes  Focused Assessment No change from prior assessment  Early Detection of Sepsis Score *See Row Information* Medium  MEWS guidelines implemented *See Row Information* Yes  Notify: Charge Nurse/RN  Name of Charge Nurse/RN Notified  (I am charge nurse, discussed with fellow nurses)  Date Charge Nurse/RN Notified 10/16/19  Time Charge Nurse/RN Notified 2015

## 2019-10-17 NOTE — Progress Notes (Signed)
PROGRESS NOTE  Leroy [name]  DOB: 1966-01-12  PCP: Lucia Gaskins, MD RKY:[id number]  DOA: 10/15/2019  LOS: 2 days   Chief Complaint  Patient presents with  . Abdominal Pain    Brief narrative: Leroy [name] is a 54 y.o. male with PMH of chronic alcohol abuse, history of GI bleeding, hypertension, hyperlipidemia. Patient presented to the ED on 10/15/2019 with complaint of progressively worsening nausea, vomiting, abdominal pain, distention for 3 to 4 days. He drinks beer and hard liquor on a regular basis.  He says he would not be able to quantify but says 'it is at a problem level.'   Last alcohol drink was on Monday (8/6).    In the ED, he had a temperature 100.5, tachycardic to 135, tachypneic, oxygen saturation maintained on room air, blood pressure 160/91 Labs showed lipase level elevated to 114. CT abdomen and pelvis showed stranding around the pancreatic head and uncinate process continuing into the right pararenal space compatible with acute pancreatitis.  Patient was started on conservative management for acute pancreatitis and admitted to hospital service for further evaluation management.  Subjective: Patient was seen and examined this morning.  Continues to have abdominal pain but little better than yesterday.  Abdominal distention remains.  Constipated for 5 days. No alcohol withdrawal symptoms. Overnight, patient had episodes of temperature over 100, maximum of 101.1 this morning Labs from this morning show sodium level down to 127, magnesium down to 1.5, lipase improving to 23, WBC level worsening to 17.9  Assessment/Plan: Acute pancreatitis -likely alcoholic -Presented with worsening abdominal pain, distention, nausea vomiting in the setting of chronic alcoholism. -Lipase level elevated. CT scan with evidence of acute pancreatitis. -Right upper quadrant ultrasound did not show any biliary stone. -Currently on conservative management with IV fluids, pain  control.   -Continues to have abdominal pain requiring IV pain medicines. Lipase level however is better today. -Continue LR at 125 mill per hour, IV morphine 2 mg every 4 hours as needed, IV Zofran as needed, IV Protonix -Start on clear liquid diet today.  Persistent fever/leukocytosis SIRS positive with no convincing evidence of sepsis -On arrival, patient was febrile, tachypneic, tachycardic. Likely secondary to acute pancreatitis and related pain. Continues to have fever more than 100 last 24 hours T-max 101.1 last night. -We will obtain blood culture and start patient on IV Rocephin empirically. -Afebrile pain does not improve and patient has persistent fever, will repeat CT scan of abdomen tomorrow. -Continue to monitor.  Acute hyponatremia -Worsening hyponatremia 127 today.  Continue to monitor. Recent Labs  Lab 10/15/19 1713 10/16/19 0406 10/17/19 0639  NA 134* 133* 127*   Chronic alcoholism High risk of alcohol withdrawal symptoms -Counseled to quit alcohol. -Watch out for withdrawal symptoms.  Elevated liver enzymes Hepatic steatosis -Secondary to alcohol use. -Enzymes seem to have normalized Recent Labs  Lab 10/15/19 1713 10/16/19 0406  AST 44* 26  ALT 33 24  ALKPHOS 100 74  BILITOT 1.6* 1.2  PROT 9.5* 7.7  ALBUMIN 4.6 3.6   History of GI bleed -No evidence of active bleeding at this time. Continue Protonix  Essential hypertension Continue home amlodipine  Hyperlipidemia -Zocor on hold due to elevated AST  Constipation/abdominal distention -No bowel movement in 5 days.  Give soapsuds enema today.  Mobility: Encourage ambulation Code Status:   Code Status: Full Code  Nutritional status: Body mass index is 23.75 kg/m.     Diet Order  Diet clear liquid Room service appropriate? Yes; Fluid consistency: Thin  Diet effective now                 DVT prophylaxis: enoxaparin (LOVENOX) injection 40 mg Start: 10/15/19 2200 SCDs Start:  10/15/19 2151   Antimicrobials:  None Fluid: LR@125  Consultants: None Family Communication:  None at bedside  Status is: Inpatient  Remains inpatient appropriate because: Requires IV fluid, IV pain medicine, inpatient monitoring  Dispo: The patient is from: Home              Anticipated d/c is to: Home              Anticipated d/c date is: 3 days              Patient currently is not medically stable to d/c.  Infusions:  . cefTRIAXone (ROCEPHIN)  IV      Scheduled Meds: . amLODipine  10 mg Oral Daily  . enoxaparin (LOVENOX) injection  40 mg Subcutaneous Q24H  . pantoprazole (PROTONIX) IV  40 mg Intravenous Q24H    Antimicrobials: Anti-infectives (From admission, onward)   Start     Dose/Rate Route Frequency Ordered Stop   10/17/19 1445  cefTRIAXone (ROCEPHIN) 1 g in sodium chloride 0.9 % 100 mL IVPB        1 g 200 mL/hr over 30 Minutes Intravenous Every 24 hours 10/17/19 1440        PRN meds: morphine injection, ondansetron (ZOFRAN) IV   Objective: Vitals:   10/17/19 0500 10/17/19 1436  BP: 128/72 (!) 141/93  Pulse: 98 (!) 122  Resp: 20 18  Temp: 100.2 F (37.9 C) (!) 100.8 F (38.2 C)  SpO2: 99% 99%   No intake or output data in the 24 hours ending 10/17/19 1442 Filed Weights   10/15/19 1413  Weight: 86.2 kg   Weight change:  Body mass index is 23.75 kg/m.   Physical Exam: General exam: Appears calm and comfortable.  Continues to have no pain and distention  skin: No rashes, lesions or ulcers. HEENT: Atraumatic, normocephalic, supple neck, no obvious bleeding Lungs: Clear to auscultation bilaterally CVS: Regular rate and rhythm, no murmur GI/Abd distended, remains diffusely tender, epigastric tenderness most pronounced, bowel sound present CNS: Alert, awake, oriented x3 Psychiatry: Depressed look Extremities: No pedal edema, no calf tenderness  Data Review: I have personally reviewed the laboratory data and studies available.  Recent Labs  Lab  10/15/19 1713 10/16/19 0406 10/17/19 0639  WBC 11.8* 13.7* 17.9*  NEUTROABS  --   --  15.9*  HGB 15.8 13.2 12.3*  HCT 47.5 39.5 36.8*  MCV 92.6 93.2 93.6  PLT 337 261 219   Recent Labs  Lab 10/15/19 1713 10/16/19 0406 10/17/19 0639  NA 134* 133* 127*  K 3.8 3.8 3.5  CL 97* 100 95*  CO2 24 23 23   GLUCOSE 131* 130* 99  BUN 12 10 9   CREATININE 0.95 0.73 0.58*  CALCIUM 9.7 8.7* 8.8*  MG  --   --  1.5*   Signed, Terrilee Croak, MD [facility] 10/17/2019

## 2019-10-17 NOTE — Progress Notes (Signed)
°   10/17/19 1436  Assess: MEWS Score  Temp (!) 100.8 F (38.2 C)  BP (!) 141/93  Pulse Rate (!) 122  Resp 18  Level of Consciousness Alert  SpO2 99 %  O2 Device Room Air  Assess: MEWS Score  MEWS Temp 1  MEWS Systolic 0  MEWS Pulse 2  MEWS RR 0  MEWS LOC 0  MEWS Score 3  MEWS Score Color Yellow  Assess: if the MEWS score is Yellow or Red  Were vital signs taken at a resting state? Yes  Focused Assessment Change from prior assessment (see assessment flowsheet)  Early Detection of Sepsis Score *See Row Information* Medium  MEWS guidelines implemented *See Row Information* Yes  Take Vital Signs  Increase Vital Sign Frequency  Yellow: Q 2hr X 2 then Q 4hr X 2, if remains yellow, continue Q 4hrs  Escalate  MEWS: Escalate Yellow: discuss with charge nurse/RN and consider discussing with provider and RRT  Notify: Charge Nurse/RN  Name of Charge Nurse/RN Notified Stephanie Acre  (primary nurse is charge nurse)  Date Charge Nurse/RN Notified 10/17/19  Time Charge Nurse/RN Notified 1448  Notify: Provider  Provider Name/Title Dr. Pietro Cassis  Date Provider Notified 10/17/19  Time Provider Notified 1448  Notification Type Page  Notification Reason Other (Comment)  Response See new orders  Date of Provider Response 10/17/19  Time of Provider Response 1448

## 2019-10-18 LAB — BASIC METABOLIC PANEL
Anion gap: 11 (ref 5–15)
BUN: 9 mg/dL (ref 6–20)
CO2: 23 mmol/L (ref 22–32)
Calcium: 8.5 mg/dL — ABNORMAL LOW (ref 8.9–10.3)
Chloride: 93 mmol/L — ABNORMAL LOW (ref 98–111)
Creatinine, Ser: 0.6 mg/dL — ABNORMAL LOW (ref 0.61–1.24)
GFR calc Af Amer: >60 mL/min (ref >60)
GFR calc non Af Amer: >60 mL/min (ref >60)
Glucose, Bld: 83 mg/dL (ref 70–99)
Potassium: 3.2 mmol/L — ABNORMAL LOW (ref 3.5–5.1)
Sodium: 127 mmol/L — ABNORMAL LOW (ref 135–145)

## 2019-10-18 LAB — CBC WITH DIFFERENTIAL/PLATELET
Abs Immature Granulocytes: 0.13 10*3/uL — ABNORMAL HIGH (ref 0.00–0.07)
Basophils Absolute: 0 10*3/uL (ref 0.0–0.1)
Basophils Relative: 0 %
Eosinophils Absolute: 0 10*3/uL (ref 0.0–0.5)
Eosinophils Relative: 0 %
HCT: 34.1 % — ABNORMAL LOW (ref 39.0–52.0)
Hemoglobin: 11.4 g/dL — ABNORMAL LOW (ref 13.0–17.0)
Immature Granulocytes: 1 %
Lymphocytes Relative: 5 %
Lymphs Abs: 0.7 10*3/uL (ref 0.7–4.0)
MCH: 31.1 pg (ref 26.0–34.0)
MCHC: 33.4 g/dL (ref 30.0–36.0)
MCV: 92.9 fL (ref 80.0–100.0)
Monocytes Absolute: 1.8 10*3/uL — ABNORMAL HIGH (ref 0.1–1.0)
Monocytes Relative: 13 %
Neutro Abs: 11.2 10*3/uL — ABNORMAL HIGH (ref 1.7–7.7)
Neutrophils Relative %: 81 %
Platelets: 229 10*3/uL (ref 150–400)
RBC: 3.67 MIL/uL — ABNORMAL LOW (ref 4.22–5.81)
RDW: 13.9 % (ref 11.5–15.5)
WBC: 13.8 10*3/uL — ABNORMAL HIGH (ref 4.0–10.5)
nRBC: 0 % (ref 0.0–0.2)

## 2019-10-18 LAB — MAGNESIUM: Magnesium: 1.5 mg/dL — ABNORMAL LOW (ref 1.7–2.4)

## 2019-10-18 LAB — LIPASE, BLOOD: Lipase: 33 U/L (ref 11–51)

## 2019-10-18 LAB — PHOSPHORUS: Phosphorus: 2.3 mg/dL — ABNORMAL LOW (ref 2.5–4.6)

## 2019-10-18 MED ORDER — MORPHINE SULFATE (PF) 2 MG/ML IV SOLN: 1.0000 mg | INTRAVENOUS | Status: DC | PRN

## 2019-10-18 MED ORDER — POTASSIUM CHLORIDE CRYS ER 20 MEQ PO TBCR
40.0000 meq | EXTENDED_RELEASE_TABLET | ORAL | Status: AC
  Administered 2019-10-18 (×2): 40 meq via ORAL
  Filled 2019-10-18 (×2): qty 2

## 2019-10-18 NOTE — Progress Notes (Signed)
PROGRESS NOTE  Leroy [name]  DOB: [birth year]  PCP: Lucia Gaskins, MD IOX:[id number]  DOA: 10/15/2019  LOS: 3 days   Chief Complaint  Patient presents with  . Abdominal Pain    Brief narrative: Leroy [name] is a 54 y.o. male with PMH of chronic alcohol abuse, history of GI bleeding, hypertension, hyperlipidemia. Patient presented to the ED on 10/15/2019 with complaint of progressively worsening nausea, vomiting, abdominal pain, distention for 3 to 4 days. He drinks beer and hard liquor on a regular basis.  He says he would not be able to quantify but says 'it is at a problem level.'   Last alcohol drink was on Monday (8/6).    In the ED, he had a temperature 100.5, tachycardic to 135, tachypneic, oxygen saturation maintained on room air, blood pressure 160/91 Labs showed lipase level elevated to 114. CT abdomen and pelvis showed stranding around the pancreatic head and uncinate process continuing into the right pararenal space compatible with acute pancreatitis.  Patient was started on conservative management for acute pancreatitis and admitted to hospital service for further evaluation management.  Subjective: Patient was seen and examined this morning.   Feels better than yesterday. Less pain in the abdomen. No nausea no vomiting. Remains n.p.o.  No fever overnight. Remains persistently tachycardic. Labs from this morning with improving lipase level.  Assessment/Plan: Acute pancreatitis -likely alcoholic -Presented with worsening abdominal pain, distention, nausea vomiting in the setting of chronic alcoholism. -Lipase level elevated. CT scan with evidence of acute pancreatitis. -Right upper quadrant ultrasound did not show any biliary stone. -Currently on conservative management with IV fluids, pain control.   -Because of worsening of abdominal pain, repeat CT scan of abdomen was obtained on 9/10 which showed worsening acute pancreatitis. -IV fluid was continued.  Patient was switched back from liquid diet to n.p.o. status. -Symptoms improving. Lipase level improving as well. -I will still keep him n.p.o. today -Continue LR at 125 mill per hour, IV morphine 2 mg every 4 hours as needed, IV Zofran as needed, IV Protonix Recent Labs  Lab 10/15/19 1713 10/16/19 0406 10/17/19 0639 10/18/19 0652  LIPASE 114* 71* 23 33   Fever/leukocytosis SIRS positive with no convincing evidence of sepsis -On arrival, patient was febrile, tachypneic, tachycardic. Likely secondary to acute pancreatitis and related pain. Continued to have fever more than 100 for the next 24 hours. WBC count was up as well.  -He was empirically started on IV Rocephin. Fever trending down, WBC count trending down.  Recent Labs  Lab 10/15/19 1713 10/16/19 0406 10/17/19 0639 10/18/19 0652  WBC 11.8* 13.7* 17.9* 13.8*   Persistent tachycardia -Last 24 hours, patient's heart rate has been more the 110, sinus tachycardia -Likely related to pancreatitis. Continue to monitor.  Acute hyponatremia -Sodium level remains low 127. Expected to improve once oral intake is allowed.  -Continue to monitor for now. Recent Labs  Lab 10/15/19 1713 10/16/19 0406 10/17/19 0639 10/18/19 0652  NA 134* 133* 127* 127*   Hypokalemia/hypomagnesemia -Replaced with oral potassium chloride and IV magnesium. Recent Labs  Lab 10/15/19 1713 10/16/19 0406 10/17/19 0639 10/18/19 0632 10/18/19 0652  K 3.8 3.8 3.5  --  3.2*  MG  --   --  1.5* 1.5*  --    Chronic alcoholism High risk of alcohol withdrawal symptoms -Counseled to quit alcohol. -Watch out for withdrawal symptoms.  Elevated liver enzymes Hepatic steatosis -Secondary to alcohol use. -Enzymes seem to have normalized Recent Labs  Lab 10/15/19 1713 10/16/19  0406  AST 44* 26  ALT 33 24  ALKPHOS 100 74  BILITOT 1.6* 1.2  PROT 9.5* 7.7  ALBUMIN 4.6 3.6   History of GI bleed -No evidence of active bleeding at this time. Continue  Protonix  Essential hypertension Continue home amlodipine  Hyperlipidemia -Zocor on hold due to elevated AST  Constipation/abdominal distention -9/10, had bowel movement with enema..  Mobility: Encourage ambulation Code Status:   Code Status: Full Code  Nutritional status: Body mass index is 23.75 kg/m.     Diet Order            Diet NPO time specified Except for: Sips with Meds, Ice Chips  Diet effective now                 DVT prophylaxis: enoxaparin (LOVENOX) injection 40 mg Start: 10/15/19 2200 SCDs Start: 10/15/19 2151   Antimicrobials:  None Fluid: NS@125  Consultants: None Family Communication:  None at bedside  Status is: Inpatient  Remains inpatient appropriate because: Requires IV fluid, IV pain medicine, inpatient monitoring  Dispo: The patient is from: Home              Anticipated d/c is to: Home              Anticipated d/c date is: 2 days              Patient currently is not medically stable to d/c.  Infusions:  . sodium chloride 150 mL/hr at 10/18/19 1120  . cefTRIAXone (ROCEPHIN)  IV Stopped (10/17/19 1650)    Scheduled Meds: . amLODipine  10 mg Oral Daily  . enoxaparin (LOVENOX) injection  40 mg Subcutaneous Q24H  . pantoprazole (PROTONIX) IV  40 mg Intravenous Q24H  . potassium chloride  40 mEq Oral Q2H    Antimicrobials: Anti-infectives (From admission, onward)   Start     Dose/Rate Route Frequency Ordered Stop   10/17/19 1445  cefTRIAXone (ROCEPHIN) 2 g in sodium chloride 0.9 % 100 mL IVPB        2 g 200 mL/hr over 30 Minutes Intravenous Every 24 hours 10/17/19 1440        PRN meds: morphine injection, ondansetron (ZOFRAN) IV   Objective: Vitals:   10/17/19 2248 10/18/19 0230  BP: 134/90 140/87  Pulse: (!) 105 (!) 110  Resp: 18 18  Temp: 100.3 F (37.9 C) 99.6 F (37.6 C)  SpO2: 98% 98%    Intake/Output Summary (Last 24 hours) at 10/18/2019 1315 Last data filed at 10/18/2019 0600 Gross per 24 hour  Intake 1404.58  ml  Output --  Net 1404.58 ml   Filed Weights   10/15/19 1413  Weight: 86.2 kg   Weight change:  Body mass index is 23.75 kg/m.   Physical Exam: General exam: Appears calm and comfortable.  Continues to have no pain and distention  skin: No rashes, lesions or ulcers. HEENT: Atraumatic, normocephalic, supple neck, no obvious bleeding Lungs: Clear to auscultation bilaterally CVS: Regular rate and rhythm, no murmur GI/Abd distended, remains diffusely tender, epigastric tenderness most pronounced, bowel sound present CNS: Alert, awake, oriented x3 Psychiatry: Depressed look Extremities: No pedal edema, no calf tenderness  Data Review: I have personally reviewed the laboratory data and studies available.  Recent Labs  Lab 10/15/19 1713 10/16/19 0406 10/17/19 0639 10/18/19 0652  WBC 11.8* 13.7* 17.9* 13.8*  NEUTROABS  --   --  15.9* 11.2*  HGB 15.8 13.2 12.3* 11.4*  HCT 47.5 39.5 36.8* 34.1*  MCV  92.6 93.2 93.6 92.9  PLT 337 261 219 229   Recent Labs  Lab 10/15/19 1713 10/16/19 0406 10/17/19 0639 10/18/19 0632 10/18/19 0652  NA 134* 133* 127*  --  127*  K 3.8 3.8 3.5  --  3.2*  CL 97* 100 95*  --  93*  CO2 24 23 23   --  23  GLUCOSE 131* 130* 99  --  83  BUN 12 10 9   --  9  CREATININE 0.95 0.73 0.58*  --  0.60*  CALCIUM 9.7 8.7* 8.8*  --  8.5*  MG  --   --  1.5* 1.5*  --   PHOS  --   --   --  2.3*  --    Signed, Terrilee Croak, MD [facility] 10/18/2019

## 2019-10-19 LAB — BASIC METABOLIC PANEL
Anion gap: 9 (ref 5–15)
BUN: 8 mg/dL (ref 6–20)
CO2: 23 mmol/L (ref 22–32)
Calcium: 8.3 mg/dL — ABNORMAL LOW (ref 8.9–10.3)
Chloride: 93 mmol/L — ABNORMAL LOW (ref 98–111)
Creatinine, Ser: 0.59 mg/dL — ABNORMAL LOW (ref 0.61–1.24)
GFR calc Af Amer: >60 mL/min (ref >60)
GFR calc non Af Amer: >60 mL/min (ref >60)
Glucose, Bld: 99 mg/dL (ref 70–99)
Potassium: 3.3 mmol/L — ABNORMAL LOW (ref 3.5–5.1)
Sodium: 125 mmol/L — ABNORMAL LOW (ref 135–145)

## 2019-10-19 LAB — CBC WITH DIFFERENTIAL/PLATELET
Abs Immature Granulocytes: 0.11 10*3/uL — ABNORMAL HIGH (ref 0.00–0.07)
Basophils Absolute: 0 10*3/uL (ref 0.0–0.1)
Basophils Relative: 0 %
Eosinophils Absolute: 0 10*3/uL (ref 0.0–0.5)
Eosinophils Relative: 0 %
HCT: 31.6 % — ABNORMAL LOW (ref 39.0–52.0)
Hemoglobin: 10.5 g/dL — ABNORMAL LOW (ref 13.0–17.0)
Immature Granulocytes: 1 %
Lymphocytes Relative: 6 %
Lymphs Abs: 0.7 10*3/uL (ref 0.7–4.0)
MCH: 30.9 pg (ref 26.0–34.0)
MCHC: 33.2 g/dL (ref 30.0–36.0)
MCV: 92.9 fL (ref 80.0–100.0)
Monocytes Absolute: 2.1 10*3/uL — ABNORMAL HIGH (ref 0.1–1.0)
Monocytes Relative: 19 %
Neutro Abs: 8.4 10*3/uL — ABNORMAL HIGH (ref 1.7–7.7)
Neutrophils Relative %: 74 %
Platelets: 282 10*3/uL (ref 150–400)
RBC: 3.4 MIL/uL — ABNORMAL LOW (ref 4.22–5.81)
RDW: 13.9 % (ref 11.5–15.5)
WBC: 11.3 10*3/uL — ABNORMAL HIGH (ref 4.0–10.5)
nRBC: 0 % (ref 0.0–0.2)

## 2019-10-19 LAB — LIPASE, BLOOD: Lipase: 67 U/L — ABNORMAL HIGH (ref 11–51)

## 2019-10-19 LAB — MAGNESIUM: Magnesium: 1.6 mg/dL — ABNORMAL LOW (ref 1.7–2.4)

## 2019-10-19 MED ORDER — MORPHINE SULFATE (PF) 2 MG/ML IV SOLN: 1.0000 mg | Freq: Four times a day (QID) | INTRAVENOUS | Status: DC | PRN

## 2019-10-19 MED ORDER — POTASSIUM CHLORIDE CRYS ER 20 MEQ PO TBCR
40.0000 meq | EXTENDED_RELEASE_TABLET | Freq: Once | ORAL | Status: AC
  Administered 2019-10-19: 40 meq via ORAL
  Filled 2019-10-19: qty 2

## 2019-10-19 MED ORDER — MAGNESIUM SULFATE 2 GM/50ML IV SOLN
2.0000 g | Freq: Once | INTRAVENOUS | Status: AC
  Administered 2019-10-19: 2 g via INTRAVENOUS
  Filled 2019-10-19: qty 50

## 2019-10-19 MED ORDER — POTASSIUM CHLORIDE CRYS ER 20 MEQ PO TBCR
40.0000 meq | EXTENDED_RELEASE_TABLET | ORAL | Status: AC
  Administered 2019-10-19: 40 meq via ORAL
  Filled 2019-10-19: qty 2

## 2019-10-19 NOTE — Progress Notes (Signed)
PROGRESS NOTE  Leroy [name]  DOB: May 02, 1965  PCP: Lucia Gaskins, MD XVQ:[id number]  DOA: 10/15/2019  LOS: 4 days   Chief Complaint  Patient presents with  . Abdominal Pain    Brief narrative: Leroy [name] is a 54 y.o. male with PMH of chronic alcohol abuse, history of GI bleeding, hypertension, hyperlipidemia. Patient presented to the ED on 10/15/2019 with complaint of progressively worsening nausea, vomiting, abdominal pain, distention for 3 to 4 days. He drinks beer and hard liquor on a regular basis.  He says he would not be able to quantify but says 'it is at a problem level.'   Last alcohol drink was on Monday (8/6).    In the ED, he had a temperature 100.5, tachycardic to 135, tachypneic, oxygen saturation maintained on room air, blood pressure 160/91 Labs showed lipase level elevated to 114. CT abdomen and pelvis showed stranding around the pancreatic head and uncinate process continuing into the right pararenal space compatible with acute pancreatitis.  Patient was started on conservative management for acute pancreatitis and admitted to hospital service for further evaluation management.  Subjective: Patient was seen and examined this morning.   Feels better.  Abdominal pain improving.  Wants to eat T-max 9.6.  Heart rate improving down to 90s today. Labs this morning with low sodium, potassium and magnesium.  Assessment/Plan: Acute pancreatitis -likely alcoholic -Presented with worsening abdominal pain, distention, nausea vomiting in the setting of chronic alcoholism. -Lipase level elevated. CT scan with evidence of acute pancreatitis. -Right upper quadrant ultrasound did not show any biliary stone. -Started on conservative management with IV fluids, pain control.   -Repeat CT scan of abdomen was obtained on 9/10 which showed worsening acute pancreatitis. -Clinically improving now.  Lipase level was down to normal, slightly up to 6 7 today but clinically  patient feels better.  Start on clear liquid diet today. -Reduce fluid to 75 mill per hour.  Reduce IV morphine to 1 mg every 6 hours as needed.  Continue IV Zofran as needed, IV Protonix. Recent Labs  Lab 10/15/19 1713 10/16/19 0406 10/17/19 0639 10/18/19 0652 10/19/19 0740  LIPASE 114* 71* 23 33 67*   Fever/leukocytosis SIRS positive with no convincing evidence of sepsis -On arrival, patient was febrile, tachypneic, tachycardic. Likely secondary to acute pancreatitis and related pain. Continued to have fever more than 100 for the next 24 hours. WBC count was up as well.  -He was empirically started on IV Rocephin. Fever trending down, WBC count trending down.  -Continue IV Rocephin.  Continue to monitor temperature and WBC trend. Recent Labs  Lab 10/15/19 1713 10/16/19 0406 10/17/19 0639 10/18/19 0652 10/19/19 0740  WBC 11.8* 13.7* 17.9* 13.8* 11.3*   Persistent tachycardia -Likely secondary to pancreatitis.  Tachycardia improving now.  In the 90s in last 24 hours.  Acute hyponatremia -worsening -Sodium level remains further down to 125 today.  Probably secondary to n.p.o. status.   -Continue to monitor for now. Recent Labs  Lab 10/15/19 1713 10/16/19 0406 10/17/19 0639 10/18/19 0652 10/19/19 0740  NA 134* 133* 127* 127* 125*   Hypokalemia/hypomagnesemia -This morning, potassium level is 3.3 and magnesium is 1.6.  Replacement ordered.  Recheck.   Recent Labs  Lab 10/15/19 1713 10/16/19 0406 10/17/19 0639 10/18/19 0932 10/18/19 0652 10/19/19 0740  K 3.8 3.8 3.5  --  3.2* 3.3*  MG  --   --  1.5* 1.5*  --  1.6*   Chronic alcoholism High risk of alcohol withdrawal symptoms -Counseled  to quit alcohol. -Watch out for withdrawal symptoms.  Elevated liver enzymes Hepatic steatosis -Secondary to alcohol use. -Enzymes seem to have normalized Recent Labs  Lab 10/15/19 1713 10/16/19 0406  AST 44* 26  ALT 33 24  ALKPHOS 100 74  BILITOT 1.6* 1.2  PROT 9.5*  7.7  ALBUMIN 4.6 3.6   History of GI bleed -No evidence of active bleeding at this time. Continue Protonix  Essential hypertension Continue home amlodipine  Hyperlipidemia -Zocor on hold due to elevated AST  Constipation/abdominal distention -9/10, had bowel movement with enema..  Mobility: Encourage ambulation Code Status:   Code Status: Full Code  Nutritional status: Body mass index is 23.75 kg/m.     Diet Order            Diet clear liquid Room service appropriate? Yes; Fluid consistency: Thin  Diet effective now                 DVT prophylaxis: enoxaparin (LOVENOX) injection 40 mg Start: 10/15/19 2200 SCDs Start: 10/15/19 2151   Antimicrobials:  None Fluid: NS@75  mill per hour Consultants: None Family Communication:  None at bedside  Status is: Inpatient  Remains inpatient appropriate because: Requires IV fluid, IV pain medicine, inpatient monitoring  Dispo: The patient is from: Home              Anticipated d/c is to: Home              Anticipated d/c date is: Hopefully tomorrow              Patient currently is not medically stable to d/c.  Infusions:  . sodium chloride 150 mL/hr at 10/18/19 1120  . cefTRIAXone (ROCEPHIN)  IV 2 g (10/18/19 1458)  . magnesium sulfate bolus IVPB      Scheduled Meds: . amLODipine  10 mg Oral Daily  . enoxaparin (LOVENOX) injection  40 mg Subcutaneous Q24H  . pantoprazole (PROTONIX) IV  40 mg Intravenous Q24H  . potassium chloride  40 mEq Oral Q2H    Antimicrobials: Anti-infectives (From admission, onward)   Start     Dose/Rate Route Frequency Ordered Stop   10/17/19 1445  cefTRIAXone (ROCEPHIN) 2 g in sodium chloride 0.9 % 100 mL IVPB        2 g 200 mL/hr over 30 Minutes Intravenous Every 24 hours 10/17/19 1440        PRN meds: morphine injection, ondansetron (ZOFRAN) IV   Objective: Vitals:   10/19/19 0000 10/19/19 0600  BP:  136/90  Pulse: 100 92  Resp:  20  Temp:  98.6 F (37 C)  SpO2:  100%     Intake/Output Summary (Last 24 hours) at 10/19/2019 1140 Last data filed at 10/18/2019 1500 Gross per 24 hour  Intake 786.18 ml  Output --  Net 786.18 ml   Filed Weights   10/15/19 1413  Weight: 86.2 kg   Weight change:  Body mass index is 23.75 kg/m.   Physical Exam: General exam: Appears calm and comfortable.  Improving pain and distention.  Feels better skin: No rashes, lesions or ulcers. HEENT: Atraumatic, normocephalic, supple neck, no obvious bleeding Lungs: Clear to auscultation bilaterally CVS: Regular rate and rhythm, no murmur GI/Abd distended, epigastric tenderness improved.  Bowel sound present CNS: Alert, awake, oriented x3 Psychiatry: Depressed look Extremities: No pedal edema, no calf tenderness  Data Review: I have personally reviewed the laboratory data and studies available.  Recent Labs  Lab 10/15/19 1713 10/16/19 0406 10/17/19 8032  10/18/19 0652 10/19/19 0740  WBC 11.8* 13.7* 17.9* 13.8* 11.3*  NEUTROABS  --   --  15.9* 11.2* 8.4*  HGB 15.8 13.2 12.3* 11.4* 10.5*  HCT 47.5 39.5 36.8* 34.1* 31.6*  MCV 92.6 93.2 93.6 92.9 92.9  PLT 337 261 219 229 282   Recent Labs  Lab 10/15/19 1713 10/16/19 0406 10/17/19 0639 10/18/19 0632 10/18/19 0652 10/19/19 0740  NA 134* 133* 127*  --  127* 125*  K 3.8 3.8 3.5  --  3.2* 3.3*  CL 97* 100 95*  --  93* 93*  CO2 24 23 23   --  23 23  GLUCOSE 131* 130* 99  --  83 99  BUN 12 10 9   --  9 8  CREATININE 0.95 0.73 0.58*  --  0.60* 0.59*  CALCIUM 9.7 8.7* 8.8*  --  8.5* 8.3*  MG  --   --  1.5* 1.5*  --  1.6*  PHOS  --   --   --  2.3*  --   --    Signed, Terrilee Croak, MD [facility] 10/19/2019

## 2019-10-20 LAB — LIPASE, BLOOD: Lipase: 127 U/L — ABNORMAL HIGH (ref 11–51)

## 2019-10-20 MED ORDER — ONDANSETRON HCL 4 MG PO TABS: 4.0000 mg | ORAL_TABLET | Freq: Every day | ORAL | 1 refills | Status: DC | PRN

## 2019-10-20 MED ORDER — PANTOPRAZOLE SODIUM 40 MG PO TBEC: 40.0000 mg | DELAYED_RELEASE_TABLET | Freq: Every day | ORAL | 0 refills | Status: DC

## 2019-10-20 MED ORDER — OXYCODONE-ACETAMINOPHEN 5-325 MG PO TABS: 1.0000 | ORAL_TABLET | ORAL | 0 refills | Status: AC | PRN

## 2019-10-20 MED ORDER — CEFDINIR 300 MG PO CAPS: 300.0000 mg | ORAL_CAPSULE | Freq: Two times a day (BID) | ORAL | 0 refills | Status: AC

## 2019-10-20 NOTE — Discharge Summary (Signed)
Physician Discharge Summary  Bryan Goin DGU:[id number] DOB: 12-04-1965 DOA: 10/15/2019  PCP: Lucia Gaskins, MD  Admit date: 10/15/2019 Discharge date: 10/20/2019  Admitted From: Home Discharge disposition: Home   Code Status: Full Code  Diet Recommendation: Liquid diet for next 2 to 3 days and advance to soft diet next 2 to 3 days before advancing to regular diet  Discharge Diagnosis:   Principal Problem:   Acute pancreatitis Active Problems:   Alcohol abuse   History of GI bleed   Essential hypertension   Hyperlipidemia   Abdominal pain   Vomiting   Elevated AST (SGOT)  History of Present Illness / Brief narrative:  Leroy [name] is a 54 y.o. male with PMH of chronic alcohol abuse, history of GI bleeding, hypertension, hyperlipidemia. Patient presented to the ED on 10/15/2019 with complaint of progressively worsening nausea, vomiting, abdominal pain, distention for 3 to 4 days. He drinks beer and hard liquor on a regular basis.  He says he would not be able to quantify but says 'it is at a problem level.'   Last alcohol drink was on Monday (8/6).   In the ED, he had a temperature 100.5, tachycardic to 135, tachypneic, oxygen saturation maintained on room air, blood pressure 160/91 Labs showed lipase level elevated to 114. CT abdomen and pelvis showed stranding around the pancreatic head and uncinate process continuing into the right pararenal space compatible with acute pancreatitis. Patient was started on conservative management for acute pancreatitis and admitted to hospital service for further evaluation management.  Subjective:  Seen and examined this morning. Denies vomiting.  No abdominal pain.  Currently on clear liquid diet. Wants to go home.  Hospital Course:  Acute pancreatitis -likely alcoholic -Presented with worsening abdominal pain, distention, nausea vomiting in the setting of chronic alcoholism. -Lipase level elevated. CT scan with evidence  of acute pancreatitis. -Right upper quadrant ultrasound did not show any biliary stone. -Started on conservative management with IV fluids, pain control.   -Repeat CT scan of abdomen was obtained on 9/10 which showed worsening acute pancreatitis.  Because of that, he was made n.p.o. till lipase level improved.  Lipase level eventually improved and is started on clear liquid diet.  Tolerated diet well.  Abdominal pain resolved.  However bilirubin is increasing again.  Patient feels much better than at presentation and wants to be discharged home.  I offered him 1-2 more days of inpatient hospital monitoring which he refuses.  I instructed him to stay on liquid diet for the next 2 to 3 days before advancing to soft diet and eventually to regular diet. -Given a prescription for Zofran and Percocet as needed. Recent Labs  Lab 10/16/19 0406 10/17/19 0639 10/18/19 0652 10/19/19 0740 10/20/19 0633  LIPASE 71* 23 33 67* 127*   Recent Labs  Lab 10/15/19 1713 10/16/19 0406  AST 44* 26  ALT 33 24  ALKPHOS 100 74  BILITOT 1.6* 1.2  PROT 9.5* 7.7  ALBUMIN 4.6 3.6   Fever/leukocytosis SIRS positive with no convincing evidence of sepsis -On arrival, patient was febrile, tachypneic, tachycardic. Likely secondary to acute pancreatitis and related pain. Continued to have fever more than 100 for the next 24 hours. WBC count was up as well.  -He was empirically started on IV Rocephin. Fever trending down, WBC count trending down.  -Oral Omnicef for next 5 days at discharge. Recent Labs  Lab 10/15/19 1713 10/16/19 0406 10/17/19 0639 10/18/19 0652 10/19/19 0740  WBC 11.8* 13.7* 17.9* 13.8* 11.3*  Acute hyponatremia -worsening -Sodium level remains further down to 125 on last check on 9/12.  Probably secondary to diminished oral intake -Continue to monitor as an outpatient with PCP  Recent Labs  Lab 10/15/19 1713 10/16/19 0406 10/17/19 0639 10/18/19 0652 10/19/19 0740  NA 134* 133* 127*  127* 125*   Hypokalemia/hypomagnesemia -Deficiency secondary to poor oral intake.  Electrolytes replaced. Recent Labs  Lab 10/15/19 1713 10/16/19 0406 10/17/19 3299 10/18/19 2426 10/18/19 0652 10/19/19 0740  K 3.8 3.8 3.5  --  3.2* 3.3*  MG  --   --  1.5* 1.5*  --  1.6*   Chronic alcoholism -Counseled to quit alcohol. -No withdrawal symptoms in the hospital.  Elevated liver enzymes Hepatic steatosis -Secondary to alcohol use. -Enzymes seem to have normalized Recent Labs  Lab 10/15/19 1713 10/16/19 0406  AST 44* 26  ALT 33 24  ALKPHOS 100 74  BILITOT 1.6* 1.2  PROT 9.5* 7.7  ALBUMIN 4.6 3.6   History of GI bleed -No evidence of active bleeding at this time. Continue Protonix  Essential hypertension Continue home amlodipine  Hyperlipidemia -Zocor  Constipation/abdominal distention -9/10, had bowel movement with enema.Faythe Ghee to discharge home at patient's request.  I called and updated patient's wife as well.  Instructed to come back to the hospital if symptoms were to get worse.  Wound care:    Discharge Exam:   Vitals:   10/19/19 1252 10/19/19 1411 10/19/19 2256 10/20/19 0524  BP: (!) 141/91 (!) 148/92 129/86 136/84  Pulse: (!) 102  91 92  Resp: 20 20 16 16   Temp: 98.9 F (37.2 C) 98.6 F (37 C) (!) 100.4 F (38 C) 99.3 F (37.4 C)  TempSrc: Oral Oral Oral Oral  SpO2: 97%  98% 98%  Weight:      Height:        Body mass index is 23.75 kg/m.  General exam: Feels better than at presentation Skin: No rashes, lesions or ulcers. HEENT: Atraumatic, normocephalic, supple neck, no obvious bleeding Lungs: Clear to auscultation bilaterally CVS: Regular rate and rhythm, no murmur GI/Abd soft nontender, nondistended, bowel sound present CNS: Alert, awake, oriented x3 Psychiatry: Mood appropriate Extremities: No pedal edema, no calf tenderness  Follow ups:   Discharge Instructions    Diet - low sodium heart healthy   Complete by: As directed     Increase activity slowly   Complete by: As directed       Follow-up Information    Lucia Gaskins, MD Follow up in 1 week(s).   Specialty: Internal Medicine Contact information: [place] [address] [phone]               Recommendations for Outpatient Follow-Up:   1. Follow-up with PCP as an outpatient  Discharge Instructions:  Follow with Primary MD Lucia Gaskins, MD in 7 days   Get CBC/CMP checked in next visit within 1 week by PCP or SNF MD ( we routinely change or add medications that can affect your baseline labs and fluid status, therefore we recommend that you get the mentioned basic workup next visit with your PCP, your PCP may decide not to get them or add new tests based on their clinical decision)  On your next visit with your PCP, please Get Medicines reviewed and adjusted.  Please request your PCP  to go over all Hospital Tests and Procedure/Radiological results at the follow up, please get all Hospital records sent to your Prim MD by signing  hospital release before you go home.  Activity: As tolerated with Full fall precautions use walker/cane & assistance as needed  For Heart failure patients - Check your Weight same time everyday, if you gain over 2 pounds, or you develop in leg swelling, experience more shortness of breath or chest pain, call your Primary MD immediately. Follow Cardiac Low Salt Diet and 1.5 lit/day fluid restriction.  If you have smoked or chewed Tobacco in the last 2 yrs please stop smoking, stop any regular Alcohol  and or any Recreational drug use.  If you experience worsening of your admission symptoms, develop shortness of breath, life threatening emergency, suicidal or homicidal thoughts you must seek medical attention immediately by calling 911 or calling your MD immediately  if symptoms less severe.  You Must read complete instructions/literature along with all the possible adverse reactions/side  effects for all the Medicines you take and that have been prescribed to you. Take any new Medicines after you have completely understood and accpet all the possible adverse reactions/side effects.   Do not drive, operate heavy machinery, perform activities at heights, swimming or participation in water activities or provide baby sitting services if your were admitted for syncope or siezures until you have seen by Primary MD or a Neurologist and advised to do so again.  Do not drive when taking Pain medications.  Do not take more than prescribed Pain, Sleep and Anxiety Medications  Wear Seat belts while driving.   Please note You were cared for by a hospitalist during your hospital stay. If you have any questions about your discharge medications or the care you received while you were in the hospital after you are discharged, you can call the unit and asked to speak with the hospitalist on call if the hospitalist that took care of you is not available. Once you are discharged, your primary care physician will handle any further medical issues. Please note that NO REFILLS for any discharge medications will be authorized once you are discharged, as it is imperative that you return to your primary care physician (or establish a relationship with a primary care physician if you do not have one) for your aftercare needs so that they can reassess your need for medications and monitor your lab values.   Allergies as of 10/20/2019   No Known Allergies     Medication List    TAKE these medications   amLODipine 10 MG tablet Commonly known as: NORVASC Take 10 mg by mouth daily.   cefdinir 300 MG capsule Commonly known as: OMNICEF Take 1 capsule (300 mg total) by mouth 2 (two) times daily for 5 days.   ondansetron 4 MG tablet Commonly known as: Zofran Take 1 tablet (4 mg total) by mouth daily as needed for nausea or vomiting.   oxyCODONE-acetaminophen 5-325 MG tablet Commonly known as:  Percocet Take 1 tablet by mouth every 4 (four) hours as needed for up to 5 days for severe pain.   pantoprazole 40 MG tablet Commonly known as: Protonix Take 1 tablet (40 mg total) by mouth daily.   simvastatin 40 MG tablet Commonly known as: ZOCOR Take 40 mg by mouth daily.      Time coordinating discharge: 35 minutes  The results of significant diagnostics from this hospitalization (including imaging, microbiology, ancillary and laboratory) are listed below for reference.    Procedures and Diagnostic Studies:   CT ABDOMEN PELVIS W CONTRAST  Result Date: 10/15/2019 CLINICAL DATA:  Nausea, vomiting, abdominal pain EXAM:  CT ABDOMEN AND PELVIS WITH CONTRAST TECHNIQUE: Multidetector CT imaging of the abdomen and pelvis was performed using the standard protocol following bolus administration of intravenous contrast. CONTRAST:  167mL OMNIPAQUE IOHEXOL 300 MG/ML  SOLN COMPARISON:  None. FINDINGS: Lower chest: No acute abnormality. Hepatobiliary: Diffuse fatty infiltration. Calcification within the right hepatic lobe appears benign. No suspicious focal hepatic abnormality. Gallbladder unremarkable. Pancreas: There is inflammation/stranding around the pancreatic head and uncinate process compatible with acute pancreatitis. Fluid tracks in the anterior pararenal space and around the 2nd and 3rd portions of the duodenum. Spleen: No focal abnormality.  Normal size. Adrenals/Urinary Tract: No adrenal abnormality. No focal renal abnormality. No stones or hydronephrosis. Urinary bladder is unremarkable. Stomach/Bowel: Normal appendix. Stomach, large and small bowel grossly unremarkable. Vascular/Lymphatic: Scattered aortic atherosclerosis. No evidence of aneurysm or adenopathy. Reproductive: Prostate enlargement with central calcifications. Other: No free fluid or free air. Musculoskeletal: No acute bony abnormality. IMPRESSION: Stranding around the pancreatic head and uncinate process continuing into the  right pararenal space compatible with acute pancreatitis. Aortic atherosclerosis. Electronically Signed   By: Rolm Baptise M.D.   On: 10/15/2019 19:32   US Abdomen Limited RUQ  Result Date: 10/16/2019 CLINICAL DATA:  Nausea and vomiting with abdominal pain X 2 weeks EXAM: ULTRASOUND ABDOMEN LIMITED RIGHT UPPER QUADRANT COMPARISON:  CT abdomen pelvis 10/15/2019 FINDINGS: Gallbladder: No gallstones or wall thickening visualized. No sonographic Murphy sign noted by sonographer. Common bile duct: Diameter: 4 mm Liver: Heterogeneous and increased parenchymal echogenicity. Ill-defined, minimally vascular, 2.7 x 1.9 cm hypodense area within the left hepatic lobe. Portal vein is patent on color Doppler imaging with normal direction of blood flow towards the liver. Other: None. IMPRESSION: Hepatic steatosis with an ill-defined 2.7 cm hypodense area within the left hepatic lobe that likely represents an area of focal fatty sparing versus true lesion. Consider MRI liver protocol for further evaluation. Electronically Signed   By: Iven Finn M.D.   On: 10/16/2019 08:19     Labs:   Basic Metabolic Panel: Recent Labs  Lab 10/15/19 1713 10/15/19 1713 10/16/19 0406 10/16/19 0406 10/17/19 2694 10/17/19 8546 10/18/19 2703 10/18/19 0652 10/19/19 0740  NA 134*  --  133*  --  127*  --   --  127* 125*  K 3.8   < > 3.8   < > 3.5   < >  --  3.2* 3.3*  CL 97*  --  100  --  95*  --   --  93* 93*  CO2 24  --  23  --  23  --   --  23 23  GLUCOSE 131*  --  130*  --  99  --   --  83 99  BUN 12  --  10  --  9  --   --  9 8  CREATININE 0.95  --  0.73  --  0.58*  --   --  0.60* 0.59*  CALCIUM 9.7  --  8.7*  --  8.8*  --   --  8.5* 8.3*  MG  --   --   --   --  1.5*  --  1.5*  --  1.6*  PHOS  --   --   --   --   --   --  2.3*  --   --    < > = values in this interval not displayed.   GFR Estimated Creatinine Clearance: 129.1 mL/min (A) (by C-G formula based on SCr  of 0.59 mg/dL (L)). Liver Function  Tests: Recent Labs  Lab 10/15/19 1713 10/16/19 0406  AST 44* 26  ALT 33 24  ALKPHOS 100 74  BILITOT 1.6* 1.2  PROT 9.5* 7.7  ALBUMIN 4.6 3.6   Recent Labs  Lab 10/16/19 0406 10/17/19 0639 10/18/19 0652 10/19/19 0740 10/20/19 0633  LIPASE 71* 23 33 67* 127*   No results for input(s): AMMONIA in the last 168 hours. Coagulation profile Recent Labs  Lab 10/16/19 0406  INR 1.2    CBC: Recent Labs  Lab 10/15/19 1713 10/16/19 0406 10/17/19 0639 10/18/19 0652 10/19/19 0740  WBC 11.8* 13.7* 17.9* 13.8* 11.3*  NEUTROABS  --   --  15.9* 11.2* 8.4*  HGB 15.8 13.2 12.3* 11.4* 10.5*  HCT 47.5 39.5 36.8* 34.1* 31.6*  MCV 92.6 93.2 93.6 92.9 92.9  PLT 337 261 219 229 282   Cardiac Enzymes: No results for input(s): CKTOTAL, CKMB, CKMBINDEX, TROPONINI in the last 168 hours. BNP: Invalid input(s): POCBNP CBG: No results for input(s): GLUCAP in the last 168 hours. D-Dimer No results for input(s): DDIMER in the last 72 hours. Hgb A1c No results for input(s): HGBA1C in the last 72 hours. Lipid Profile No results for input(s): CHOL, HDL, LDLCALC, TRIG, CHOLHDL, LDLDIRECT in the last 72 hours. Thyroid function studies No results for input(s): TSH, T4TOTAL, T3FREE, THYROIDAB in the last 72 hours.  Invalid input(s): FREET3 Anemia work up No results for input(s): VITAMINB12, FOLATE, FERRITIN, TIBC, IRON, RETICCTPCT in the last 72 hours. Microbiology Recent Results (from the past 240 hour(s))  SARS Coronavirus 2 by RT PCR (hospital order, performed in [facility] hospital lab) Nasopharyngeal Nasopharyngeal Swab     Status: None   Collection Time: 10/15/19  9:11 PM   Specimen: Nasopharyngeal Swab  Result Value Ref Range Status   SARS Coronavirus 2 NEGATIVE NEGATIVE Final    Comment: (NOTE) SARS-CoV-2 target nucleic acids are NOT DETECTED.  The SARS-CoV-2 RNA is generally detectable in upper and lower respiratory specimens during the acute phase of infection. The  lowest concentration of SARS-CoV-2 viral copies this assay can detect is 250 copies / mL. A negative result does not preclude SARS-CoV-2 infection and should not be used as the sole basis for treatment or other patient management decisions.  A negative result may occur with improper specimen collection / handling, submission of specimen other than nasopharyngeal swab, presence of viral mutation(s) within the areas targeted by this assay, and inadequate number of viral copies (<250 copies / mL). A negative result must be combined with clinical observations, patient history, and epidemiological information.  Fact Sheet for Patients:   StrictlyIdeas.no  Fact Sheet for Healthcare Providers: BankingDealers.co.za  This test is not yet approved or  cleared by the [place] FDA and has been authorized for detection and/or diagnosis of SARS-CoV-2 by FDA under an Emergency Use Authorization (EUA).  This EUA will remain in effect (meaning this test can be used) for the duration of the COVID-19 declaration under Section 564(b)(1) of the Act, 21 U.S.C. section 360bbb-3(b)(1), unless the authorization is terminated or revoked sooner.  Performed at [facility], [address], [place], [place] [address]   Culture, blood (routine x 2)     Status: None (Preliminary result)   Collection Time: 10/17/19  2:58 PM   Specimen: Left Antecubital; Blood  Result Value Ref Range Status   Specimen Description   Final    LEFT ANTECUBITAL BOTTLES DRAWN AEROBIC AND ANAEROBIC   Special Requests Blood  Culture adequate volume  Final   Culture   Final    NO GROWTH < 24 HOURS Performed at [facility], [address], [place], [place] [address]    Report Status PENDING  Incomplete  Culture, blood (routine x 2)     Status: None (Preliminary result)   Collection Time: 10/17/19  2:58 PM   Specimen: BLOOD LEFT HAND  Result Value Ref Range Status   Specimen  Description   Final    BLOOD LEFT HAND BOTTLES DRAWN AEROBIC AND ANAEROBIC   Special Requests Blood Culture adequate volume  Final   Culture   Final    NO GROWTH < 24 HOURS Performed at [facility], [address], [place],  [address]    Report Status PENDING  Incomplete     Signed: Hebert Dooling  [facility] 10/20/2019, 10:21 AM

## 2019-10-20 NOTE — Progress Notes (Signed)
Nsg Discharge Note  Admit Date:  10/15/2019 Discharge date: 10/20/2019   Leroy [name] to be D/C'd Home per MD order.  AVS completed.  Copy for chart, and copy for patient signed, and dated. Removed IV-clean, dry, intact. Reviewed d/c paperwork with patient including new medications and where to pick up. Gave work note. Wheeled stable patient and belongings to main entrance where he was picked up by his wife. Patient/caregiver able to verbalize understanding.  Discharge Medication: Allergies as of 10/20/2019   No Known Allergies     Medication List    TAKE these medications   amLODipine 10 MG tablet Commonly known as: NORVASC Take 10 mg by mouth daily.   cefdinir 300 MG capsule Commonly known as: OMNICEF Take 1 capsule (300 mg total) by mouth 2 (two) times daily for 5 days.   ondansetron 4 MG tablet Commonly known as: Zofran Take 1 tablet (4 mg total) by mouth daily as needed for nausea or vomiting.   oxyCODONE-acetaminophen 5-325 MG tablet Commonly known as: Percocet Take 1 tablet by mouth every 4 (four) hours as needed for up to 5 days for severe pain.   pantoprazole 40 MG tablet Commonly known as: Protonix Take 1 tablet (40 mg total) by mouth daily.   simvastatin 40 MG tablet Commonly known as: ZOCOR Take 40 mg by mouth daily.       Discharge Assessment: Vitals:   10/19/19 2256 10/20/19 0524  BP: 129/86 136/84  Pulse: 91 92  Resp: 16 16  Temp: (!) 100.4 F (38 C) 99.3 F (37.4 C)  SpO2: 98% 98%   Skin clean, dry and intact without evidence of skin break down, no evidence of skin tears noted. IV catheter discontinued intact. Site without signs and symptoms of complications - no redness or edema noted at insertion site, patient denies c/o pain - only slight tenderness at site.  Dressing with slight pressure applied.  D/c Instructions-Education: Discharge instructions given to patient/family with verbalized understanding. D/c education completed with  patient/family including follow up instructions, medication list, d/c activities limitations if indicated, with other d/c instructions as indicated by MD - patient able to verbalize understanding, all questions fully answered. Patient instructed to return to ED, call 911, or call MD for any changes in condition.  Patient escorted via [place], and D/C home via private auto.  Santa Lighter, RN 10/20/2019 11:34 AM

## 2019-10-22 LAB — CULTURE, BLOOD (ROUTINE X 2)
Culture: NO GROWTH
Culture: NO GROWTH
Special Requests: ADEQUATE
Special Requests: ADEQUATE

## 2019-11-24 NOTE — Progress Notes (Deleted)
Referring Provider:*** Primary Care Physician:  Lucia Gaskins, MD Primary Gastroenterologist:  Dr. Gala Romney  No chief complaint on file.   HPI:   Leroy [name] is a 54 y.o. male presenting today at the request of Dr. Lucia Gaskins for consult colonoscopy.  Recommended office visit due to history of alcohol use.  Last colonoscopy in 2010 for hematochezia with friable anal canal, pedunculated polyp in mid sigmoid, otherwise normal exam.  Suspected patient experienced benign anorectal bleeding in the setting of binge alcohol consumption.  Pathology revealed hyperplastic polyp.  Recommended repeat in 10 years.  Recently admitted to [facility] with acute alcohol induced pancreatitis 10/15/2019-10/20/2019.    Past Medical History:  Diagnosis Date  . Hypercholesteremia   . Hypertension     No past surgical history on file.  Current Outpatient Medications  Medication Sig Dispense Refill  . amLODipine (NORVASC) 10 MG tablet Take 10 mg by mouth daily.    . ondansetron (ZOFRAN) 4 MG tablet Take 1 tablet (4 mg total) by mouth daily as needed for nausea or vomiting. 30 tablet 1  . pantoprazole (PROTONIX) 40 MG tablet Take 1 tablet (40 mg total) by mouth daily. 30 tablet 0  . simvastatin (ZOCOR) 40 MG tablet Take 40 mg by mouth daily.     No current facility-administered medications for this visit.    Allergies as of 11/26/2019  . (No Known Allergies)    No family history on file.  Social History   Socioeconomic History  . Marital status: Married    Spouse name: Not on file  . Number of children: Not on file  . Years of education: Not on file  . Highest education level: Not on file  Occupational History  . Not on file  Tobacco Use  . Smoking status: Current Every Day Smoker    Packs/day: 0.50    Types: Cigarettes  . Smokeless tobacco: Never Used  Substance and Sexual Activity  . Alcohol use: Yes    Alcohol/week: 2.0 standard drinks    Types: 2 Cans of  beer per week  . Drug use: Yes    Types: Marijuana  . Sexual activity: Not on file  Other Topics Concern  . Not on file  Social History Narrative  . Not on file   Social Determinants of Health   Financial Resource Strain:   . Difficulty of Paying Living Expenses: Not on file  Food Insecurity:   . Worried About Charity fundraiser in the Last Year: Not on file  . Ran Out of Food in the Last Year: Not on file  Transportation Needs:   . Lack of Transportation (Medical): Not on file  . Lack of Transportation (Non-Medical): Not on file  Physical Activity:   . Days of Exercise per Week: Not on file  . Minutes of Exercise per Session: Not on file  Stress:   . Feeling of Stress : Not on file  Social Connections:   . Frequency of Communication with Friends and Family: Not on file  . Frequency of Social Gatherings with Friends and Family: Not on file  . Attends Religious Services: Not on file  . Active Member of Clubs or Organizations: Not on file  . Attends Archivist Meetings: Not on file  . Marital Status: Not on file  Intimate Partner Violence:   . Fear of Current or Ex-Partner: Not on file  . Emotionally Abused: Not on file  . Physically Abused: Not on file  .  Sexually Abused: Not on file    Review of Systems: Gen: Denies any fever, chills, fatigue, weight loss, lack of appetite.  CV: Denies chest pain, heart palpitations, peripheral edema, syncope.  Resp: Denies shortness of breath at rest or with exertion. Denies wheezing or cough.  GI: Denies dysphagia or odynophagia. Denies jaundice, hematemesis, fecal incontinence. GU : Denies urinary burning, urinary frequency, urinary hesitancy MS: Denies joint pain, muscle weakness, cramps, or limitation of movement.  Derm: Denies rash, itching, dry skin Psych: Denies depression, anxiety, memory loss, and confusion Heme: Denies bruising, bleeding, and enlarged lymph nodes.  Physical Exam: There were no vitals taken for  this visit. General:   Alert and oriented. Pleasant and cooperative. Well-nourished and well-developed.  Head:  Normocephalic and atraumatic. Eyes:  Without icterus, sclera clear and conjunctiva pink.  Ears:  Normal auditory acuity. Nose:  No deformity, discharge,  or lesions. Mouth:  No deformity or lesions, oral mucosa pink.  Neck:  Supple, without mass or thyromegaly. Lungs:  Clear to auscultation bilaterally. No wheezes, rales, or rhonchi. No distress.  Heart:  S1, S2 present without murmurs appreciated.  Abdomen:  +BS, soft, non-tender and non-distended. No HSM noted. No guarding or rebound. No masses appreciated.  Rectal:  Deferred  Msk:  Symmetrical without gross deformities. Normal posture. Pulses:  Normal pulses noted. Extremities:  Without clubbing or edema. Neurologic:  Alert and  oriented x4;  grossly normal neurologically. Skin:  Intact without significant lesions or rashes. Cervical Nodes:  No significant cervical adenopathy. Psych:  Alert and cooperative. Normal mood and affect.

## 2019-11-26 ENCOUNTER — Encounter: Payer: Self-pay | Admitting: Internal Medicine

## 2019-11-26 ENCOUNTER — Ambulatory Visit: Payer: BC Managed Care – PPO | Admitting: Gastroenterology

## 2020-01-10 ENCOUNTER — Encounter: Payer: Self-pay | Admitting: Gastroenterology

## 2020-01-10 NOTE — H&P (View-Only) (Signed)
Referring Provider: Lucia Gaskins, MD Primary Care Physician:  Lucia Gaskins, MD Primary Gastroenterologist:  Dr. Gala Romney  Chief Complaint  Patient presents with  . Consult    TCS    HPI:   Leroy [name] is a 54 y.o. male presenting today at the request of Lucia Gaskins, MD for consult colonoscopy. OV due to alcohol use. Last colonoscopy 10/22/2008 with Dr. Gala Romney due to intermittent low volume hematochezia with friable anal canal, otherwise normal rectum, pedunculated polyp in mid sigmoid (hyperplastic).  Suspected rectal bleeding was secondary to benign anal source in the setting of binge alcohol consumption.   Notably, patient was admitted 10/15/2019-10/20/2019 with acute alcohol induced pancreatitis without fluid collection, abscess, or pseudocyst. No gallstones on CT or ultrasound.  There was a ill-defined 2.7 cm hypodense area in the left hepatic lobe on ultrasound likely representing area of focal fatty sparing versus true lesion.  Stated consider MRI liver protocol for further evaluation.  He was treated supportively with clinical improvement. Notably, he also had hyponatremia on presentation that actually worsened by the time of discharge with sodium 125. Recommended OP monitoring with PCP.   Today:  No GI concerns. No abdominal pain. BMs daily. No constipation or diarrhea. Occasional brbpr in toilet water with BMs. This occurs 2-3 times a month. Occurs out of the blue. No black stools. No nausea, vomiting, GERD symptoms, or dysphagia.  No unintentional weight loss.  No known family history of colon cancer.    Has been doing well since hospital discharge.  No recurrent pancreatitis type symptoms.  Drinking alcohol about 3 days a week. 3, 24 oz beer during the week. On the weekend, about 5, 24 oz beer.   Past Medical History:  Diagnosis Date  . Hypercholesteremia   . Hypertension   . Pancreatitis 10/2019   alcohoic     Past Surgical History:  Procedure Laterality  Date  . COLONOSCOPY  10/22/2008   Dr. Gala Romney;  friable anal canal, otherwise normal rectum, pedunculated polyp in mid sigmoid (hyperplastic).     Current Outpatient Medications  Medication Sig Dispense Refill  . amLODipine (NORVASC) 10 MG tablet Take 10 mg by mouth daily.    . cholecalciferol (VITAMIN D3) 25 MCG (1000 UNIT) tablet Take 1,000 Units by mouth daily.    . simvastatin (ZOCOR) 40 MG tablet Take 40 mg by mouth daily.     No current facility-administered medications for this visit.    Allergies as of 01/12/2020  . (No Known Allergies)    Family History  Problem Relation Age of Onset  . Colon cancer Neg Hx     Social History   Socioeconomic History  . Marital status: Married    Spouse name: Not on file  . Number of children: Not on file  . Years of education: Not on file  . Highest education level: Not on file  Occupational History  . Not on file  Tobacco Use  . Smoking status: Current Every Day Smoker    Packs/day: 0.50    Types: Cigarettes  . Smokeless tobacco: Never Used  Substance and Sexual Activity  . Alcohol use: Yes    Comment: 8, 24 ounce beer a week.   . Drug use: Yes    Types: Marijuana    Comment: sometimes per pt  . Sexual activity: Not on file  Other Topics Concern  . Not on file  Social History Narrative  . Not on file   Social Determinants of Health   Financial Resource  Strain:   . Difficulty of Paying Living Expenses: Not on file  Food Insecurity:   . Worried About Charity fundraiser in the Last Year: Not on file  . Ran Out of Food in the Last Year: Not on file  Transportation Needs:   . Lack of Transportation (Medical): Not on file  . Lack of Transportation (Non-Medical): Not on file  Physical Activity:   . Days of Exercise per Week: Not on file  . Minutes of Exercise per Session: Not on file  Stress:   . Feeling of Stress : Not on file  Social Connections:   . Frequency of Communication with Friends and Family: Not on file   . Frequency of Social Gatherings with Friends and Family: Not on file  . Attends Religious Services: Not on file  . Active Member of Clubs or Organizations: Not on file  . Attends Archivist Meetings: Not on file  . Marital Status: Not on file  Intimate Partner Violence:   . Fear of Current or Ex-Partner: Not on file  . Emotionally Abused: Not on file  . Physically Abused: Not on file  . Sexually Abused: Not on file    Review of Systems: Gen: Denies any fever, chills, cold or flulike symptoms, lightheadedness, dizziness, presyncope, syncope. CV: Denies chest pain or palpitations. Resp: Denies shortness of breath or cough. GI: See HPI GU : Denies urinary burning, urinary frequency, urinary hesitancy MS: Denies joint pain  Derm: Denies rash Psych: Denies depression or anxiety Heme: See HPI  Physical Exam: BP (!) 145/86   Pulse (!) 102   Temp (!) 97.3 F (36.3 C)   Ht 6\' 3"  (1.905 m)   Wt 193 lb 9.6 oz (87.8 kg)   BMI 24.20 kg/m  General: Alert and oriented. Pleasant and cooperative. Well-nourished and well-developed.  Head: Normocephalic and atraumatic. Eyes:  Without icterus, sclera clear and conjunctiva pink.  Ears:  Normal auditory acuity. Lungs: Clear to auscultation bilaterally. No wheezes, rales, or rhonchi. No distress.  Heart:  S1, S2 present without murmurs appreciated.  Abdomen: +BS, soft, non-tender and non-distended. No HSM noted. No guarding or rebound. No masses appreciated.  Rectal: Deferred  Msk: Symmetrical without gross deformities. Normal posture. Extremities:  Without edema. Neurologic:  Alert and oriented x4;  grossly normal neurologically. Skin:  Intact without significant lesions or rashes. Psych: Normal mood and affect.

## 2020-01-10 NOTE — Progress Notes (Signed)
Referring Provider: Lucia Gaskins, MD Primary Care Physician:  Lucia Gaskins, MD Primary Gastroenterologist:  Dr. Gala Romney  Chief Complaint  Patient presents with   Consult    TCS    HPI:   Leroy [name] is a 54 y.o. male presenting today at the request of Lucia Gaskins, MD for consult colonoscopy. OV due to alcohol use. Last colonoscopy 10/22/2008 with Dr. Gala Romney due to intermittent low volume hematochezia with friable anal canal, otherwise normal rectum, pedunculated polyp in mid sigmoid (hyperplastic).  Suspected rectal bleeding was secondary to benign anal source in the setting of binge alcohol consumption.   Notably, patient was admitted 10/15/2019-10/20/2019 with acute alcohol induced pancreatitis without fluid collection, abscess, or pseudocyst. No gallstones on CT or ultrasound.  There was a ill-defined 2.7 cm hypodense area in the left hepatic lobe on ultrasound likely representing area of focal fatty sparing versus true lesion.  Stated consider MRI liver protocol for further evaluation.  He was treated supportively with clinical improvement. Notably, he also had hyponatremia on presentation that actually worsened by the time of discharge with sodium 125. Recommended OP monitoring with PCP.   Today:  No GI concerns. No abdominal pain. BMs daily. No constipation or diarrhea. Occasional brbpr in toilet water with BMs. This occurs 2-3 times a month. Occurs out of the blue. No black stools. No nausea, vomiting, GERD symptoms, or dysphagia.  No unintentional weight loss.  No known family history of colon cancer.    Has been doing well since hospital discharge.  No recurrent pancreatitis type symptoms.  Drinking alcohol about 3 days a week. 3, 24 oz beer during the week. On the weekend, about 5, 24 oz beer.   Past Medical History:  Diagnosis Date   Hypercholesteremia    Hypertension    Pancreatitis 10/2019   alcohoic     Past Surgical History:  Procedure Laterality  Date   COLONOSCOPY  10/22/2008   Dr. Gala Romney;  friable anal canal, otherwise normal rectum, pedunculated polyp in mid sigmoid (hyperplastic).     Current Outpatient Medications  Medication Sig Dispense Refill   amLODipine (NORVASC) 10 MG tablet Take 10 mg by mouth daily.     cholecalciferol (VITAMIN D3) 25 MCG (1000 UNIT) tablet Take 1,000 Units by mouth daily.     simvastatin (ZOCOR) 40 MG tablet Take 40 mg by mouth daily.     No current facility-administered medications for this visit.    Allergies as of 01/12/2020   (No Known Allergies)    Family History  Problem Relation Age of Onset   Colon cancer Neg Hx     Social History   Socioeconomic History   Marital status: Married    Spouse name: Not on file   Number of children: Not on file   Years of education: Not on file   Highest education level: Not on file  Occupational History   Not on file  Tobacco Use   Smoking status: Current Every Day Smoker    Packs/day: 0.50    Types: Cigarettes   Smokeless tobacco: Never Used  Substance and Sexual Activity   Alcohol use: Yes    Comment: 8, 24 ounce beer a week.    Drug use: Yes    Types: Marijuana    Comment: sometimes per pt   Sexual activity: Not on file  Other Topics Concern   Not on file  Social History Narrative   Not on file   Social Determinants of Health   Financial Resource  Strain:    Difficulty of Paying Living Expenses: Not on file  Food Insecurity:    Worried About [place] in the Last Year: Not on file   Ran Out of Food in the Last Year: Not on file  Transportation Needs:    Lack of Transportation (Medical): Not on file   Lack of Transportation (Non-Medical): Not on file  Physical Activity:    Days of Exercise per Week: Not on file   Minutes of Exercise per Session: Not on file  Stress:    Feeling of Stress : Not on file  Social Connections:    Frequency of Communication with Friends and Family: Not on file     Frequency of Social Gatherings with Friends and Family: Not on file   Attends Religious Services: Not on file   Active Member of Clubs or Organizations: Not on file   Attends Archivist Meetings: Not on file   Marital Status: Not on file  Intimate Partner Violence:    Fear of Current or Ex-Partner: Not on file   Emotionally Abused: Not on file   Physically Abused: Not on file   Sexually Abused: Not on file    Review of Systems: Gen: Denies any fever, chills, cold or flulike symptoms, lightheadedness, dizziness, presyncope, syncope. CV: Denies chest pain or palpitations. Resp: Denies shortness of breath or cough. GI: See HPI GU : Denies urinary burning, urinary frequency, urinary hesitancy MS: Denies joint pain  Derm: Denies rash Psych: Denies depression or anxiety Heme: See HPI  Physical Exam: BP (!) 145/86    Pulse (!) 102    Temp (!) 97.3 F (36.3 C)    Ht 6\' 3"  (1.905 m)    Wt 193 lb 9.6 oz (87.8 kg)    BMI 24.20 kg/m  General: Alert and oriented. Pleasant and cooperative. Well-nourished and well-developed.  Head: Normocephalic and atraumatic. Eyes:  Without icterus, sclera clear and conjunctiva pink.  Ears:  Normal auditory acuity. Lungs: Clear to auscultation bilaterally. No wheezes, rales, or rhonchi. No distress.  Heart:  S1, S2 present without murmurs appreciated.  Abdomen: +BS, soft, non-tender and non-distended. No HSM noted. No guarding or rebound. No masses appreciated.  Rectal: Deferred  Msk: Symmetrical without gross deformities. Normal posture. Extremities:  Without edema. Neurologic:  Alert and oriented x4;  grossly normal neurologically. Skin:  Intact without significant lesions or rashes. Psych: Normal mood and affect.

## 2020-01-12 ENCOUNTER — Encounter: Payer: Self-pay | Admitting: *Deleted

## 2020-01-12 ENCOUNTER — Other Ambulatory Visit: Payer: Self-pay

## 2020-01-12 ENCOUNTER — Telehealth: Payer: Self-pay | Admitting: *Deleted

## 2020-01-12 ENCOUNTER — Encounter: Payer: Self-pay | Admitting: Gastroenterology

## 2020-01-12 ENCOUNTER — Ambulatory Visit (INDEPENDENT_AMBULATORY_CARE_PROVIDER_SITE_OTHER): Payer: BLUE CROSS/BLUE SHIELD | Admitting: Gastroenterology

## 2020-01-12 VITALS — BP 145/86 | HR 102 | Temp 97.3°F | Ht 75.0 in | Wt 193.6 lb

## 2020-01-12 DIAGNOSIS — K625 Hemorrhage of anus and rectum: Secondary | ICD-10-CM | POA: Diagnosis not present

## 2020-01-12 DIAGNOSIS — Z86010 Personal history of colonic polyps: Secondary | ICD-10-CM

## 2020-01-12 DIAGNOSIS — Z1211 Encounter for screening for malignant neoplasm of colon: Secondary | ICD-10-CM | POA: Insufficient documentation

## 2020-01-12 NOTE — Progress Notes (Signed)
CC'ED TO PCP 

## 2020-01-12 NOTE — Assessment & Plan Note (Signed)
Addressed under history of colon polyps.

## 2020-01-12 NOTE — Telephone Encounter (Signed)
PA approved via [employer] Order ID: [id number]    Approval Valid Through: 01/12/2020 - 03/11/2020

## 2020-01-12 NOTE — Assessment & Plan Note (Deleted)
54 year old male presents today to discuss scheduling screening colonoscopy.  Last colonoscopy in September 2010 due to intermittent low-volume medic easier revealing friable anal canal, otherwise normal rectum, pedunculated polyp

## 2020-01-12 NOTE — Patient Instructions (Addendum)
Please have blood work completed at [facility] or [facility]  We will get you scheduled for a colonoscopy in the near future with Dr. Gala Romney.   Follow-up after colonoscopy per Dr. Roseanne Kaufman recommendations.   Aliene Altes, PA-C [facility]

## 2020-01-12 NOTE — Assessment & Plan Note (Addendum)
54 year old male presents today to discuss scheduling colonoscopy.  Last colonoscopy in September 2010 due to intermittent low-volume hematochezia revealing friable anal canal, otherwise normal rectum, hyperplastic polyp in the sigmoid colon.  Suspected rectal bleeding was secondary to benign anal source in the setting of binge alcohol consumption.  He continues with intermittent low-volume hematochezia 2-3 times per month.  Most recent hemoglobin 10.5 on 10/19/2019 at the time of hospital discharge for acute alcoholic pancreatitis.  Notably, hemoglobin on admission was 15.8.  Suspect decline was secondary to hemodilution.  Denies unintentional weight loss.  No family history of colon cancer.  Suspect ongoing intermittent low-volume hematochezia is likely secondary to benign anorectal source but cannot rule out colon polyps or malignancy.  Plan: Update CBC. Proceed with colonoscopy with propofol with Dr. Gala Romney in the near future. The risks, benefits, and alternatives have been discussed with the patient in detail. The patient states understanding and desires to proceed.  ASA III due to alcohol abuse/dependence. Counseled on the importance of alcohol cessation. Follow-up after colonoscopy per Dr. Roseanne Kaufman recommendations.

## 2020-01-19 LAB — CBC WITH DIFFERENTIAL/PLATELET
Absolute Monocytes: 576 cells/uL (ref 200–950)
Basophils Absolute: 18 cells/uL (ref 0–200)
Basophils Relative: 0.4 %
Eosinophils Absolute: 9 cells/uL — ABNORMAL LOW (ref 15–500)
Eosinophils Relative: 0.2 %
HCT: 42.1 % (ref 38.5–50.0)
Hemoglobin: 14.2 g/dL (ref 13.2–17.1)
Lymphs Abs: 1328 cells/uL (ref 850–3900)
MCH: 29.5 pg (ref 27.0–33.0)
MCHC: 33.7 g/dL (ref 32.0–36.0)
MCV: 87.3 fL (ref 80.0–100.0)
MPV: 10.6 fL (ref 7.5–12.5)
Monocytes Relative: 12.8 %
Neutro Abs: 2570 cells/uL (ref 1500–7800)
Neutrophils Relative %: 57.1 %
Platelets: 308 10*3/uL (ref 140–400)
RBC: 4.82 10*6/uL (ref 4.20–5.80)
RDW: 13.8 % (ref 11.0–15.0)
Total Lymphocyte: 29.5 %
WBC: 4.5 10*3/uL (ref 3.8–10.8)

## 2020-01-29 NOTE — Patient Instructions (Signed)
Henry Utsey  01/29/2020     @PREFPERIOPPHARMACY @   Your procedure is scheduled on  02/05/2020.  Report to [facility] at  1115  A.M.  Call this number if you have problems the morning of surgery:  [phone]   Remember:  Follow the diet and prep instructions given to you by the office.                     Take these medicines the morning of surgery with A SIP OF WATER  Norvasc    Do not wear jewelry, make-up or nail polish.  Do not wear lotions, powders, or perfumes. Please wear deodorant and brush your teeth.  Do not shave 48 hours prior to surgery.  Men may shave face and neck.  Do not bring valuables to the hospital.  [facility] is not responsible for any belongings or valuables.  Contacts, dentures or bridgework may not be worn into surgery.  Leave your suitcase in the car.  After surgery it may be brought to your room.  For patients admitted to the hospital, discharge time will be determined by your treatment team.  Patients discharged the day of surgery will not be allowed to drive home.   Name and phone number of your driver:   family Special instructions:  DO NOT smoke the morning of your procedure.  Please read over the following fact sheets that you were given. Anesthesia Post-op Instructions and Care and Recovery After Surgery       Colonoscopy, Adult, Care After This sheet gives you information about how to care for yourself after your procedure. Your health care provider may also give you more specific instructions. If you have problems or questions, contact your health care provider. What can I expect after the procedure? After the procedure, it is common to have:  A small amount of blood in your stool for 24 hours after the procedure.  Some gas.  Mild cramping or bloating of your abdomen. Follow these instructions at home: Eating and drinking   Drink enough fluid to keep your urine pale yellow.  Follow instructions from your  health care provider about eating or drinking restrictions.  Resume your normal diet as instructed by your health care provider. Avoid heavy or fried foods that are hard to digest. Activity  Rest as told by your health care provider.  Avoid sitting for a long time without moving. Get up to take short walks every 1-2 hours. This is important to improve blood flow and breathing. Ask for help if you feel weak or unsteady.  Return to your normal activities as told by your health care provider. Ask your health care provider what activities are safe for you. Managing cramping and bloating   Try walking around when you have cramps or feel bloated.  Apply heat to your abdomen as told by your health care provider. Use the heat source that your health care provider recommends, such as a moist heat pack or a heating pad. ? Place a towel between your skin and the heat source. ? Leave the heat on for 20-30 minutes. ? Remove the heat if your skin turns bright red. This is especially important if you are unable to feel pain, heat, or cold. You may have a greater risk of getting burned. General instructions  For the first 24 hours after the procedure: ? Do not drive or use machinery. ? Do not sign important documents. ? Do  not drink alcohol. ? Do your regular daily activities at a slower pace than normal. ? Eat soft foods that are easy to digest.  Take over-the-counter and prescription medicines only as told by your health care provider.  Keep all follow-up visits as told by your health care provider. This is important. Contact a health care provider if:  You have blood in your stool 2-3 days after the procedure. Get help right away if you have:  More than a small spotting of blood in your stool.  Large blood clots in your stool.  Swelling of your abdomen.  Nausea or vomiting.  A fever.  Increasing pain in your abdomen that is not relieved with medicine. Summary  After the procedure,  it is common to have a small amount of blood in your stool. You may also have mild cramping and bloating of your abdomen.  For the first 24 hours after the procedure, do not drive or use machinery, sign important documents, or drink alcohol.  Get help right away if you have a lot of blood in your stool, nausea or vomiting, a fever, or increased pain in your abdomen. This information is not intended to replace advice given to you by your health care provider. Make sure you discuss any questions you have with your health care provider. Document Revised: 08/19/2018 Document Reviewed: 08/19/2018 Elsevier Patient Education  [place] After These instructions provide you with information about caring for yourself after your procedure. Your health care provider may also give you more specific instructions. Your treatment has been planned according to current medical practices, but problems sometimes occur. Call your health care provider if you have any problems or questions after your procedure. What can I expect after the procedure? After your procedure, you may:  Feel sleepy for several hours.  Feel clumsy and have poor balance for several hours.  Feel forgetful about what happened after the procedure.  Have poor judgment for several hours.  Feel nauseous or vomit.  Have a sore throat if you had a breathing tube during the procedure. Follow these instructions at home: For at least 24 hours after the procedure:      Have a responsible adult stay with you. It is important to have someone help care for you until you are awake and alert.  Rest as needed.  Do not: ? Participate in activities in which you could fall or become injured. ? Drive. ? Use heavy machinery. ? Drink alcohol. ? Take sleeping pills or medicines that cause drowsiness. ? Make important decisions or sign legal documents. ? Take care of children on your own. Eating and  drinking  Follow the diet that is recommended by your health care provider.  If you vomit, drink water, juice, or soup when you can drink without vomiting.  Make sure you have little or no nausea before eating solid foods. General instructions  Take over-the-counter and prescription medicines only as told by your health care provider.  If you have sleep apnea, surgery and certain medicines can increase your risk for breathing problems. Follow instructions from your health care provider about wearing your sleep device: ? Anytime you are sleeping, including during daytime naps. ? While taking prescription pain medicines, sleeping medicines, or medicines that make you drowsy.  If you smoke, do not smoke without supervision.  Keep all follow-up visits as told by your health care provider. This is important. Contact a health care provider if:  You keep feeling  nauseous or you keep vomiting.  You feel light-headed.  You develop a rash.  You have a fever. Get help right away if:  You have trouble breathing. Summary  For several hours after your procedure, you may feel sleepy and have poor judgment.  Have a responsible adult stay with you for at least 24 hours or until you are awake and alert. This information is not intended to replace advice given to you by your health care provider. Make sure you discuss any questions you have with your health care provider. Document Revised: 04/23/2017 Document Reviewed: 05/16/2015 Elsevier Patient Education  [place].

## 2020-02-03 ENCOUNTER — Encounter (HOSPITAL_COMMUNITY)
Admission: RE | Admit: 2020-02-03 | Discharge: 2020-02-03 | Disposition: A | Discharge status: Home / Self Care | Payer: BLUE CROSS/BLUE SHIELD | Source: Ambulatory Visit | Attending: Internal Medicine | Admitting: Internal Medicine

## 2020-02-03 ENCOUNTER — Other Ambulatory Visit (HOSPITAL_COMMUNITY)
Admission: RE | Admit: 2020-02-03 | Discharge: 2020-02-03 | Disposition: A | Discharge status: Home / Self Care | Payer: BLUE CROSS/BLUE SHIELD | Source: Ambulatory Visit | Attending: Internal Medicine | Admitting: Internal Medicine

## 2020-02-03 ENCOUNTER — Other Ambulatory Visit: Payer: Self-pay

## 2020-02-03 DIAGNOSIS — Z79899 Other long term (current) drug therapy: Secondary | ICD-10-CM | POA: Diagnosis not present

## 2020-02-03 DIAGNOSIS — Z01818 Encounter for other preprocedural examination: Secondary | ICD-10-CM | POA: Insufficient documentation

## 2020-02-03 DIAGNOSIS — D123 Benign neoplasm of transverse colon: Secondary | ICD-10-CM | POA: Diagnosis not present

## 2020-02-03 DIAGNOSIS — Z20822 Contact with and (suspected) exposure to covid-19: Secondary | ICD-10-CM | POA: Insufficient documentation

## 2020-02-03 DIAGNOSIS — F17210 Nicotine dependence, cigarettes, uncomplicated: Secondary | ICD-10-CM | POA: Diagnosis not present

## 2020-02-03 DIAGNOSIS — K921 Melena: Secondary | ICD-10-CM | POA: Diagnosis not present

## 2020-02-03 DIAGNOSIS — K649 Unspecified hemorrhoids: Secondary | ICD-10-CM | POA: Diagnosis not present

## 2020-02-04 LAB — SARS CORONAVIRUS 2 (TAT 6-24 HRS): SARS Coronavirus 2: NEGATIVE

## 2020-02-05 ENCOUNTER — Other Ambulatory Visit: Payer: Self-pay

## 2020-02-05 ENCOUNTER — Ambulatory Visit (HOSPITAL_COMMUNITY): Payer: BLUE CROSS/BLUE SHIELD | Admitting: Anesthesiology

## 2020-02-05 ENCOUNTER — Ambulatory Visit (HOSPITAL_COMMUNITY)
Admission: RE | Admit: 2020-02-05 | Discharge: 2020-02-05 | Disposition: A | Discharge status: Home / Self Care | Payer: BLUE CROSS/BLUE SHIELD | Attending: Internal Medicine | Admitting: Internal Medicine

## 2020-02-05 ENCOUNTER — Encounter (HOSPITAL_COMMUNITY): Payer: Self-pay | Admitting: Internal Medicine

## 2020-02-05 ENCOUNTER — Encounter (HOSPITAL_COMMUNITY)
Admission: RE | Disposition: A | Discharge status: Home / Self Care | Payer: Self-pay | Source: Home / Self Care | Attending: Internal Medicine

## 2020-02-05 DIAGNOSIS — D123 Benign neoplasm of transverse colon: Secondary | ICD-10-CM | POA: Insufficient documentation

## 2020-02-05 DIAGNOSIS — Z20822 Contact with and (suspected) exposure to covid-19: Secondary | ICD-10-CM | POA: Insufficient documentation

## 2020-02-05 DIAGNOSIS — F17210 Nicotine dependence, cigarettes, uncomplicated: Secondary | ICD-10-CM | POA: Insufficient documentation

## 2020-02-05 DIAGNOSIS — K635 Polyp of colon: Secondary | ICD-10-CM | POA: Diagnosis not present

## 2020-02-05 DIAGNOSIS — K649 Unspecified hemorrhoids: Secondary | ICD-10-CM | POA: Insufficient documentation

## 2020-02-05 DIAGNOSIS — K921 Melena: Secondary | ICD-10-CM | POA: Diagnosis not present

## 2020-02-05 DIAGNOSIS — Z79899 Other long term (current) drug therapy: Secondary | ICD-10-CM | POA: Insufficient documentation

## 2020-02-05 HISTORY — PX: COLONOSCOPY WITH PROPOFOL: SHX5780

## 2020-02-05 HISTORY — PX: POLYPECTOMY: SHX5525

## 2020-02-05 LAB — GLUCOSE, CAPILLARY
Glucose-Capillary: 259 mg/dL — ABNORMAL HIGH (ref 70–99)
Glucose-Capillary: 86 mg/dL (ref 70–99)

## 2020-02-05 SURGERY — COLONOSCOPY WITH PROPOFOL
Anesthesia: General

## 2020-02-05 MED ORDER — PROPOFOL 10 MG/ML IV BOLUS
INTRAVENOUS | Status: AC
  Filled 2020-02-05: qty 20

## 2020-02-05 MED ORDER — STERILE WATER FOR IRRIGATION IR SOLN
Status: DC | PRN
  Administered 2020-02-05: 13:00:00 100 mL

## 2020-02-05 MED ORDER — LACTATED RINGERS IV SOLN: INTRAVENOUS | Status: DC | PRN

## 2020-02-05 MED ORDER — PROPOFOL 10 MG/ML IV BOLUS
INTRAVENOUS | Status: DC | PRN
  Administered 2020-02-05 (×3): 50 mg via INTRAVENOUS

## 2020-02-05 MED ORDER — PROPOFOL 500 MG/50ML IV EMUL
INTRAVENOUS | Status: DC | PRN
  Administered 2020-02-05: 150 ug/kg/min via INTRAVENOUS

## 2020-02-05 NOTE — Anesthesia Preprocedure Evaluation (Signed)
Anesthesia Evaluation  Patient identified by MRN, date of birth, ID band Patient awake    Reviewed: Allergy & Precautions, H&P , NPO status , Patient's Chart, lab work & pertinent test results, reviewed documented beta blocker date and time   Airway Mallampati: II  TM Distance: >3 FB Neck ROM: full    Dental no notable dental hx.    Pulmonary neg pulmonary ROS, Current Smoker and Patient abstained from smoking.,    Pulmonary exam normal breath sounds clear to auscultation       Cardiovascular Exercise Tolerance: Good hypertension, negative cardio ROS   Rhythm:regular Rate:Normal     Neuro/Psych PSYCHIATRIC DISORDERS negative neurological ROS     GI/Hepatic negative GI ROS, Neg liver ROS,   Endo/Other  negative endocrine ROS  Renal/GU negative Renal ROS  negative genitourinary   Musculoskeletal   Abdominal   Peds  Hematology negative hematology ROS (+)   Anesthesia Other Findings   Reproductive/Obstetrics negative OB ROS                             Anesthesia Physical Anesthesia Plan  ASA: II  Anesthesia Plan: General   Post-op Pain Management:    Induction:   PONV Risk Score and Plan: Propofol infusion  Airway Management Planned:   Additional Equipment:   Intra-op Plan:   Post-operative Plan:   Informed Consent: I have reviewed the patients History and Physical, chart, labs and discussed the procedure including the risks, benefits and alternatives for the proposed anesthesia with the patient or authorized representative who has indicated his/her understanding and acceptance.     Dental Advisory Given  Plan Discussed with: CRNA  Anesthesia Plan Comments:         Anesthesia Quick Evaluation

## 2020-02-05 NOTE — Interval H&P Note (Signed)
History and Physical Interval Note:  02/05/2020 1:05 PM  Leroy [name]  has presented today for surgery, with the diagnosis of Rectal bleeding.  The various methods of treatment have been discussed with the patient and family. After consideration of risks, benefits and other options for treatment, the patient has consented to  Procedure(s) with comments: COLONOSCOPY WITH PROPOFOL (N/A) - 12:45pm as a surgical intervention.  The patient's history has been reviewed, patient examined, no change in status, stable for surgery.  I have reviewed the patient's chart and labs.  Questions were answered to the patient's satisfaction.     Rashelle Ireland  No change.  Diagnostic colonoscopy per plan. The risks, benefits, limitations, alternatives and imponderables have been reviewed with the patient. Questions have been answered. All parties are agreeable.

## 2020-02-05 NOTE — Transfer of Care (Signed)
Immediate Anesthesia Transfer of Care Note  Patient: Leroy [name]  Procedure(s) Performed: COLONOSCOPY WITH PROPOFOL (N/A ) POLYPECTOMY  Patient Location: PACU  Anesthesia Type:General  Level of Consciousness: awake  Airway & Oxygen Therapy: Patient Spontanous Breathing  Post-op Assessment: Report given to RN  Post vital signs: Reviewed  Last Vitals:  Vitals Value Taken Time  BP 105/79 02/05/20 1339  Temp    Pulse 101 02/05/20 1339  Resp 17 02/05/20 1340  SpO2 96 % 02/05/20 1339  Vitals shown include unvalidated device data.  Last Pain:  Vitals:   02/05/20 1312  TempSrc:   PainSc: 0-No pain         Complications: No complications documented.

## 2020-02-05 NOTE — Op Note (Signed)
[facility] Patient Name: Leroy [name] Procedure Date: 02/05/2020 12:59 PM MRN: [id number] Date of Birth: September 22, 1965 Attending MD: Norvel Richards , MD CSN: [id number] Age: 54 Admit Type: Outpatient Procedure:                Colonoscopy Indications:              Hematochezia Providers:                Norvel Richards, MD, Lambert Mody,                            Raphael Gibney, Technician Referring MD:              Medicines:                Propofol per Anesthesia Complications:            No immediate complications. Estimated Blood Loss:     Estimated blood loss: none. Procedure:                Pre-Anesthesia Assessment:                           - Prior to the procedure, a History and Physical                            was performed, and patient medications and                            allergies were reviewed. The patient's tolerance of                            previous anesthesia was also reviewed. The risks                            and benefits of the procedure and the sedation                            options and risks were discussed with the patient.                            All questions were answered, and informed consent                            was obtained. Prior Anticoagulants: The patient has                            taken no previous anticoagulant or antiplatelet                            agents. ASA Grade Assessment: III - A patient with                            severe systemic disease. After reviewing the risks                            and benefits,  the patient was deemed in                            satisfactory condition to undergo the procedure.                           After obtaining informed consent, the colonoscope                            was passed under direct vision. Throughout the                            procedure, the patient's blood pressure, pulse, and                            oxygen saturations were  monitored continuously. The                            [id number]) scope was introduced through                            the anus and advanced to the the cecum, identified                            by appendiceal orifice and ileocecal valve. The                            colonoscopy was performed without difficulty. The                            patient tolerated the procedure well. The quality                            of the bowel preparation was adequate. Scope In: 1:19:57 PM Scope Out: 1:30:44 PM Scope Withdrawal Time: 0 hours 8 minutes 48 seconds  Total Procedure Duration: 0 hours 10 minutes 47 seconds  Findings:      Hemorrhoids were found on perianal exam. Grade 3/4 on DRE      A 6 mm polyp was found in the splenic flexure. The polyp was       pedunculated. The polyp was removed with a cold snare. Resection and       retrieval were complete.      The exam was otherwise without abnormality on direct and retroflexion       views. Impression:               - Hemorrhoids found on perianal exam.                           - One 6 mm polyp at the splenic flexure, removed                            with a cold snare. Resected and retrieved.                           -  The examination was otherwise normal on direct                            and retroflexion views. I suspect bleeding from                            hemorrhoids. Cessation of alcohol abuse will help                            deviate rectal bleeding Moderate Sedation:      Moderate (conscious) sedation was personally administered by an       anesthesia professional. The following parameters were monitored: oxygen       saturation, heart rate, blood pressure, respiratory rate, EKG, adequacy       of pulmonary ventilation, and response to care. Recommendation:           - Patient has a contact number available for                            emergencies. The signs and symptoms of potential                             delayed complications were discussed with the                            patient. Return to normal activities tomorrow.                            Written discharge instructions were provided to the                            patient.                           - Advance diet as tolerated.                           - Continue present medications. Abstinence from                            alcohol recommended.                           - Return to GI office in 1 month. Further                            recommendations to follow pending review of                            pathology report Procedure Code(s):        --- Professional ---                           [phone], Colonoscopy, flexible; with removal of  tumor(s), polyp(s), or other lesion(s) by snare                            technique Diagnosis Code(s):        --- Professional ---                           K63.5, Polyp of colon                           K64.9, Unspecified hemorrhoids                           K92.1, Melena (includes Hematochezia) CPT copyright 2019 American Medical Association. All rights reserved. The codes documented in this report are preliminary and upon coder review may  be revised to meet current compliance requirements. Cristopher Estimable. Archer Moist, MD Norvel Richards, MD 02/05/2020 1:58:08 PM This report has been signed electronically. Number of Addenda: 0

## 2020-02-05 NOTE — Anesthesia Postprocedure Evaluation (Signed)
Anesthesia Post Note  Patient: Leroy [name]  Procedure(s) Performed: COLONOSCOPY WITH PROPOFOL (N/A ) POLYPECTOMY  Patient location during evaluation: PACU Anesthesia Type: General Level of consciousness: awake and alert Pain management: pain level controlled Vital Signs Assessment: post-procedure vital signs reviewed and stable Respiratory status: spontaneous breathing Cardiovascular status: blood pressure returned to baseline and stable Postop Assessment: no apparent nausea or vomiting Anesthetic complications: no   No complications documented.   Last Vitals:  Vitals:   02/05/20 1201 02/05/20 1340  BP: (!) 130/94 105/79  Pulse:  (!) 101  Resp:  17  Temp:    SpO2:  96%    Last Pain:  Vitals:   02/05/20 1312  TempSrc:   PainSc: 0-No pain                 Loeta Herst

## 2020-02-05 NOTE — Discharge Instructions (Signed)
Colonoscopy Discharge Instructions  Read the instructions outlined below and refer to this sheet in the next few weeks. These discharge instructions provide you with general information on caring for yourself after you leave the hospital. Your doctor may also give you specific instructions. While your treatment has been planned according to the most current medical practices available, unavoidable complications occasionally occur. If you have any problems or questions after discharge, call Dr. Gala Romney at [phone]. ACTIVITY  You may resume your regular activity, but move at a slower pace for the next 24 hours.   Take frequent rest periods for the next 24 hours.   Walking will help get rid of the air and reduce the bloated feeling in your belly (abdomen).   No driving for 24 hours (because of the medicine (anesthesia) used during the test).    Do not sign any important legal documents or operate any machinery for 24 hours (because of the anesthesia used during the test).  NUTRITION  Drink plenty of fluids.   You may resume your normal diet as instructed by your doctor.   Begin with a light meal and progress to your normal diet. Heavy or fried foods are harder to digest and may make you feel sick to your stomach (nauseated).   Avoid alcoholic beverages for 24 hours or as instructed.  MEDICATIONS  You may resume your normal medications unless your doctor tells you otherwise.  WHAT YOU CAN EXPECT TODAY  Some feelings of bloating in the abdomen.   Passage of more gas than usual.   Spotting of blood in your stool or on the toilet paper.  IF YOU HAD POLYPS REMOVED DURING THE COLONOSCOPY:  No aspirin products for 7 days or as instructed.   No alcohol for 7 days or as instructed.   Eat a soft diet for the next 24 hours.  FINDING OUT THE RESULTS OF YOUR TEST Not all test results are available during your visit. If your test results are not back during the visit, make an appointment  with your caregiver to find out the results. Do not assume everything is normal if you have not heard from your caregiver or the medical facility. It is important for you to follow up on all of your test results.  SEEK IMMEDIATE MEDICAL ATTENTION IF:  You have more than a spotting of blood in your stool.   Your belly is swollen (abdominal distention).   You are nauseated or vomiting.   You have a temperature over 101.   You have abdominal pain or discomfort that is severe or gets worse throughout the day.   Polyp and hemorrhoid information provided  You are likely bleeding from hemorrhoids intermittently  1 polyp removed from your colon.  Information on hemorrhoid and hemorrhoid banding.  Please stop consuming alcohol.  This will help your hemorrhoids as much as anything.  Further recommendations to follow pending review of pathology report     Colon Polyps  Polyps are tissue growths inside the body. Polyps can grow in many places, including the large intestine (colon). A polyp may be a round bump or a mushroom-shaped growth. You could have one polyp or several. Most colon polyps are noncancerous (benign). However, some colon polyps can become cancerous over time. Finding and removing the polyps early can help prevent this. What are the causes? The exact cause of colon polyps is not known. What increases the risk? You are more likely to develop this condition if you:  Have a family  history of colon cancer or colon polyps.  Are older than 89 or older than 45 if you are African American.  Have inflammatory bowel disease, such as ulcerative colitis or Crohn's disease.  Have certain hereditary conditions, such as: ? Familial adenomatous polyposis. ? Lynch syndrome. ? Turcot syndrome. ? Peutz-Jeghers syndrome.  Are overweight.  Smoke cigarettes.  Do not get enough exercise.  Drink too much alcohol.  Eat a diet that is high in fat and red meat and low in  fiber.  Had childhood cancer that was treated with abdominal radiation. What are the signs or symptoms? Most polyps do not cause symptoms. If you have symptoms, they may include:  Blood coming from your rectum when having a bowel movement.  Blood in your stool. The stool may look dark red or black.  Abdominal pain.  A change in bowel habits, such as constipation or diarrhea. How is this diagnosed? This condition is diagnosed with a colonoscopy. This is a procedure in which a lighted, flexible scope is inserted into the anus and then passed into the colon to examine the area. Polyps are sometimes found when a colonoscopy is done as part of routine cancer screening tests. How is this treated? Treatment for this condition involves removing any polyps that are found. Most polyps can be removed during a colonoscopy. Those polyps will then be tested for cancer. Additional treatment may be needed depending on the results of testing. Follow these instructions at home: Lifestyle  Maintain a healthy weight, or lose weight if recommended by your health care provider.  Exercise every day or as told by your health care provider.  Do not use any products that contain nicotine or tobacco, such as cigarettes and e-cigarettes. If you need help quitting, ask your health care provider.  If you drink alcohol, limit how much you have: ? 0-1 drink a day for women. ? 0-2 drinks a day for men.  Be aware of how much alcohol is in your drink. In the [place] one drink equals one 12 oz bottle of beer (355 mL), one 5 oz glass of wine (148 mL), or one 1 oz shot of hard liquor (44 mL). Eating and drinking   Eat foods that are high in fiber, such as fruits, vegetables, and whole grains.  Eat foods that are high in calcium and vitamin D, such as milk, cheese, yogurt, eggs, liver, fish, and broccoli.  Limit foods that are high in fat, such as fried foods and desserts.  Limit the amount of red meat and  processed meat you eat, such as hot dogs, sausage, bacon, and lunch meats. General instructions  Keep all follow-up visits as told by your health care provider. This is important. ? This includes having regularly scheduled colonoscopies. ? Talk to your health care provider about when you need a colonoscopy. Contact a health care provider if:  You have new or worsening bleeding during a bowel movement.  You have new or increased blood in your stool.  You have a change in bowel habits.  You lose weight for no known reason. Summary  Polyps are tissue growths inside the body. Polyps can grow in many places, including the colon.  Most colon polyps are noncancerous (benign), but some can become cancerous over time.  This condition is diagnosed with a colonoscopy.  Treatment for this condition involves removing any polyps that are found. Most polyps can be removed during a colonoscopy. This information is not intended to replace advice given to  you by your health care provider. Make sure you discuss any questions you have with your health care provider. Document Revised: 05/10/2017 Document Reviewed: 05/10/2017 Elsevier Patient Education  [place].    Hemorrhoids Hemorrhoids are swollen veins in and around the rectum or anus. There are two types of hemorrhoids:  Internal hemorrhoids. These occur in the veins that are just inside the rectum. They may poke through to the outside and become irritated and painful.  External hemorrhoids. These occur in the veins that are outside the anus and can be felt as a painful swelling or hard lump near the anus. Most hemorrhoids do not cause serious problems, and they can be managed with home treatments such as diet and lifestyle changes. If home treatments do not help the symptoms, procedures can be done to shrink or remove the hemorrhoids. What are the causes? This condition is caused by increased pressure in the anal area. This pressure  may result from various things, including:  Constipation.  Straining to have a bowel movement.  Diarrhea.  Pregnancy.  Obesity.  Sitting for long periods of time.  Heavy lifting or other activity that causes you to strain.  Anal sex.  Riding a bike for a long period of time. What are the signs or symptoms? Symptoms of this condition include:  Pain.  Anal itching or irritation.  Rectal bleeding.  Leakage of stool (feces).  Anal swelling.  One or more lumps around the anus. How is this diagnosed? This condition can often be diagnosed through a visual exam. Other exams or tests may also be done, such as:  An exam that involves feeling the rectal area with a gloved hand (digital rectal exam).  An exam of the anal canal that is done using a small tube (anoscope).  A blood test, if you have lost a significant amount of blood.  A test to look inside the colon using a flexible tube with a camera on the end (sigmoidoscopy or colonoscopy). How is this treated? This condition can usually be treated at home. However, various procedures may be done if dietary changes, lifestyle changes, and other home treatments do not help your symptoms. These procedures can help make the hemorrhoids smaller or remove them completely. Some of these procedures involve surgery, and others do not. Common procedures include:  Rubber band ligation. Rubber bands are placed at the base of the hemorrhoids to cut off their blood supply.  Sclerotherapy. Medicine is injected into the hemorrhoids to shrink them.  Infrared coagulation. A type of light energy is used to get rid of the hemorrhoids.  Hemorrhoidectomy surgery. The hemorrhoids are surgically removed, and the veins that supply them are tied off.  Stapled hemorrhoidopexy surgery. The surgeon staples the base of the hemorrhoid to the rectal wall. Follow these instructions at home: Eating and drinking   Eat foods that have a lot of fiber in  them, such as whole grains, beans, nuts, fruits, and vegetables.  Ask your health care provider about taking products that have added fiber (fiber supplements).  Reduce the amount of fat in your diet. You can do this by eating low-fat dairy products, eating less red meat, and avoiding processed foods.  Drink enough fluid to keep your urine pale yellow. Managing pain and swelling   Take warm sitz baths for 20 minutes, 3-4 times a day to ease pain and discomfort. You may do this in a bathtub or using a portable sitz bath that fits over the toilet.  If directed, apply ice to the affected area. Using ice packs between sitz baths may be helpful. ? Put ice in a plastic bag. ? Place a towel between your skin and the bag. ? Leave the ice on for 20 minutes, 2-3 times a day. General instructions  Take over-the-counter and prescription medicines only as told by your health care provider.  Use medicated creams or suppositories as told.  Get regular exercise. Ask your health care provider how much and what kind of exercise is best for you. In general, you should do moderate exercise for at least 30 minutes on most days of the week (150 minutes each week). This can include activities such as walking, biking, or yoga.  Go to the bathroom when you have the urge to have a bowel movement. Do not wait.  Avoid straining to have bowel movements.  Keep the anal area dry and clean. Use wet toilet paper or moist towelettes after a bowel movement.  Do not sit on the toilet for long periods of time. This increases blood pooling and pain.  Keep all follow-up visits as told by your health care provider. This is important. Contact a health care provider if you have:  Increasing pain and swelling that are not controlled by treatment or medicine.  Difficulty having a bowel movement, or you are unable to have a bowel movement.  Pain or inflammation outside the area of the hemorrhoids. Get help right away if  you have:  Uncontrolled bleeding from your rectum. Summary  Hemorrhoids are swollen veins in and around the rectum or anus.  Most hemorrhoids can be managed with home treatments such as diet and lifestyle changes.  Taking warm sitz baths can help ease pain and discomfort.  In severe cases, procedures or surgery can be done to shrink or remove the hemorrhoids. This information is not intended to replace advice given to you by your health care provider. Make sure you discuss any questions you have with your health care provider. Document Revised: 06/21/2018 Document Reviewed: 06/14/2017 Elsevier Patient Education  [place] After These instructions provide you with information about caring for yourself after your procedure. Your health care provider may also give you more specific instructions. Your treatment has been planned according to current medical practices, but problems sometimes occur. Call your health care provider if you have any problems or questions after your procedure. What can I expect after the procedure? After your procedure, you may:  Feel sleepy for several hours.  Feel clumsy and have poor balance for several hours.  Feel forgetful about what happened after the procedure.  Have poor judgment for several hours.  Feel nauseous or vomit.  Have a sore throat if you had a breathing tube during the procedure. Follow these instructions at home: For at least 24 hours after the procedure:      Have a responsible adult stay with you. It is important to have someone help care for you until you are awake and alert.  Rest as needed.  Do not: ? Participate in activities in which you could fall or become injured. ? Drive. ? Use heavy machinery. ? Drink alcohol. ? Take sleeping pills or medicines that cause drowsiness. ? Make important decisions or sign legal documents. ? Take care of children on your own. Eating and  drinking  Follow the diet that is recommended by your health care provider.  If you vomit, drink water, juice, or soup when  you can drink without vomiting.  Make sure you have little or no nausea before eating solid foods. General instructions  Take over-the-counter and prescription medicines only as told by your health care provider.  If you have sleep apnea, surgery and certain medicines can increase your risk for breathing problems. Follow instructions from your health care provider about wearing your sleep device: ? Anytime you are sleeping, including during daytime naps. ? While taking prescription pain medicines, sleeping medicines, or medicines that make you drowsy.  If you smoke, do not smoke without supervision.  Keep all follow-up visits as told by your health care provider. This is important. Contact a health care provider if:  You keep feeling nauseous or you keep vomiting.  You feel light-headed.  You develop a rash.  You have a fever. Get help right away if:  You have trouble breathing. Summary  For several hours after your procedure, you may feel sleepy and have poor judgment.  Have a responsible adult stay with you for at least 24 hours or until you are awake and alert. This information is not intended to replace advice given to you by your health care provider. Make sure you discuss any questions you have with your health care provider. Document Revised: 04/23/2017 Document Reviewed: 05/16/2015 Elsevier Patient Education  [place].

## 2020-02-09 LAB — SURGICAL PATHOLOGY

## 2020-02-10 ENCOUNTER — Encounter: Payer: Self-pay | Admitting: Internal Medicine

## 2020-02-11 ENCOUNTER — Encounter (HOSPITAL_COMMUNITY): Payer: Self-pay | Admitting: Internal Medicine

## 2020-03-19 ENCOUNTER — Encounter: Payer: Self-pay | Admitting: Internal Medicine

## 2020-04-13 ENCOUNTER — Ambulatory Visit: Payer: BLUE CROSS/BLUE SHIELD | Admitting: Internal Medicine

## 2020-04-13 ENCOUNTER — Encounter: Payer: Self-pay | Admitting: Internal Medicine

## 2020-05-03 ENCOUNTER — Emergency Department (HOSPITAL_COMMUNITY)
Admission: EM | Admit: 2020-05-03 | Discharge: 2020-05-03 | Disposition: A | Discharge status: Home / Self Care | Payer: BLUE CROSS/BLUE SHIELD | Attending: Emergency Medicine | Admitting: Emergency Medicine

## 2020-05-03 ENCOUNTER — Encounter (HOSPITAL_COMMUNITY): Payer: Self-pay

## 2020-05-03 ENCOUNTER — Other Ambulatory Visit: Payer: Self-pay

## 2020-05-03 ENCOUNTER — Emergency Department (HOSPITAL_COMMUNITY): Payer: BLUE CROSS/BLUE SHIELD

## 2020-05-03 DIAGNOSIS — I10 Essential (primary) hypertension: Secondary | ICD-10-CM | POA: Insufficient documentation

## 2020-05-03 DIAGNOSIS — F17210 Nicotine dependence, cigarettes, uncomplicated: Secondary | ICD-10-CM | POA: Insufficient documentation

## 2020-05-03 DIAGNOSIS — M545 Low back pain, unspecified: Secondary | ICD-10-CM | POA: Insufficient documentation

## 2020-05-03 DIAGNOSIS — Z79899 Other long term (current) drug therapy: Secondary | ICD-10-CM | POA: Diagnosis not present

## 2020-05-03 MED ORDER — LIDOCAINE 5 % EX PTCH
1.0000 | MEDICATED_PATCH | CUTANEOUS | Status: DC
  Administered 2020-05-03: 1 via TRANSDERMAL
  Filled 2020-05-03: qty 1

## 2020-05-03 MED ORDER — CYCLOBENZAPRINE HCL 10 MG PO TABS
5.0000 mg | ORAL_TABLET | Freq: Once | ORAL | Status: AC
  Administered 2020-05-03: 5 mg via ORAL
  Filled 2020-05-03: qty 1

## 2020-05-03 MED ORDER — LIDOCAINE 5 % EX PTCH: 1.0000 | MEDICATED_PATCH | CUTANEOUS | 0 refills | Status: DC

## 2020-05-03 MED ORDER — CYCLOBENZAPRINE HCL 5 MG PO TABS: 5.0000 mg | ORAL_TABLET | Freq: Two times a day (BID) | ORAL | 0 refills | Status: DC | PRN

## 2020-05-03 MED ORDER — IBUPROFEN 800 MG PO TABS
800.0000 mg | ORAL_TABLET | Freq: Once | ORAL | Status: AC
  Administered 2020-05-03: 800 mg via ORAL
  Filled 2020-05-03: qty 1

## 2020-05-03 NOTE — ED Triage Notes (Signed)
Pt presents to ED with complaints of lower back pain x 1 week, occasionally radiates down both legs, denies numbness

## 2020-05-03 NOTE — ED Provider Notes (Signed)
[facility] EMERGENCY DEPARTMENT Provider Note   CSN: [id number] Arrival date & time: 05/03/20  9622     History Chief Complaint  Patient presents with  . Back Pain    Leroy Leroy [name] is a 55 y.o. male.  HPI   55 year old male with past medical history of HTN, HLD presents the emergency department with lower back pain.  Patient states this gradually started about a week ago, initially intermittent but is now more constant.  He points to the lateral lumbar/SI area as to where the pain is worse, states that the pain does sometimes shoot down the back of both of his legs.  He denies any numbness or weakness of the lower extremities.  Has been ambulatory at baseline.  Denies any genitourinary or GI symptoms.  No fever or recent injury.  Past Medical History:  Diagnosis Date  . Hypercholesteremia   . Hypertension   . Pancreatitis 10/2019   alcohoic     Patient Active Problem List   Diagnosis Date Noted  . Colon cancer screening 01/12/2020  . Rectal bleeding 01/12/2020  . History of colonic polyps 01/12/2020  . Abdominal pain 10/16/2019  . Vomiting 10/16/2019  . Elevated AST (SGOT) 10/16/2019  . 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 Acute pancreatitis 10/15/2019  . History of GI bleed 10/15/2019  . Essential hypertension 10/15/2019  . Hyperlipidemia 10/15/2019  . Fractured lateral malleolus 11/17/2013  . Orthostatic hypotension 07/26/2012  . Alcohol abuse 10/15/2008  . GI BLEED 10/15/2008    Past Surgical History:  Procedure Laterality Date  . COLONOSCOPY  10/22/2008   Dr. Gala Romney;  friable anal canal, otherwise normal rectum, pedunculated polyp in mid sigmoid (hyperplastic).   . COLONOSCOPY WITH PROPOFOL N/A 02/05/2020   Procedure: COLONOSCOPY WITH PROPOFOL;  Surgeon: Daneil Dolin, MD;  Location: [facility] ENDO SUITE;  Service: Endoscopy;  Laterality: N/A;  12:45pm  . POLYPECTOMY  02/05/2020   Procedure: POLYPECTOMY;  Surgeon: Daneil Dolin, MD;  Location: [facility] ENDO  SUITE;  Service: Endoscopy;;       Family History  Problem Relation Age of Onset  . Colon cancer Neg Hx     Social History   Tobacco Use  . Smoking status: Current Every Leroy [name] Smoker    Packs/Leroy [name]: 0.50    Types: Cigarettes  . Smokeless tobacco: Never Used  Substance Use Topics  . Alcohol use: Yes    Comment: 8, 24 ounce beer a week.   . Drug use: Yes    Types: Marijuana    Comment: sometimes per pt    Home Medications Prior to Admission medications   Medication Sig Start Date End Date Taking? Authorizing Provider  amLODipine (NORVASC) 10 MG tablet Take 10 mg by mouth daily. 07/25/19   [provider]  Cholecalciferol (VITAMIN D-3) 125 MCG (5000 UT) TABS Take 5,000 Units by mouth daily.    [provider]  simvastatin (ZOCOR) 40 MG tablet Take 40 mg by mouth daily. 08/21/19   [provider]    Allergies    Patient has no known allergies.  Review of Systems   Review of Systems  Constitutional: Negative for chills and fever.  HENT: Negative for congestion.   Eyes: Negative for visual disturbance.  Respiratory: Negative for shortness of breath.   Cardiovascular: Negative for chest pain.  Gastrointestinal: Negative for abdominal pain, diarrhea and vomiting.  Genitourinary: Negative for dysuria.  Musculoskeletal: Positive for back pain. Negative for gait problem and neck pain.  Skin: Negative for rash.  Neurological: Negative for numbness and headaches.    Physical Exam Updated Vital Signs BP (!) 136/101   Pulse 99   Temp 98.8 F (37.1 C) (Oral)   Resp 18   Ht 6\' 3"  (1.905 m)   Wt 88.5 kg   SpO2 99%   BMI 24.37 kg/m   Physical Exam Vitals and nursing note reviewed.  Constitutional:      Appearance: Normal appearance.  HENT:     Head: Normocephalic.     Mouth/Throat:     Mouth: Mucous membranes are moist.  Cardiovascular:     Rate and Rhythm: Normal rate.  Pulmonary:     Effort: Pulmonary effort is normal. No respiratory  distress.  Abdominal:     Palpations: Abdomen is soft.     Tenderness: There is no abdominal tenderness.  Musculoskeletal:     Comments: Tenderness to palpation of the lateral lumbar musculature into the SI joints, no midline spine tenderness, no step-offs, he is neurovascularly intact in the lower extremities, steady gait  Skin:    General: Skin is warm.  Neurological:     Mental Status: He is alert and oriented to person, place, and time. Mental status is at baseline.  Psychiatric:        Mood and Affect: Mood normal.     ED Results / Procedures / Treatments   Labs (all labs ordered are listed, but only abnormal results are displayed) Labs Reviewed - No data to display  EKG None  Radiology No results found.  Procedures Procedures   Medications Ordered in ED Medications  lidocaine (LIDODERM) 5 % 1 patch (has no administration in time range)  cyclobenzaprine (FLEXERIL) tablet 5 mg (has no administration in time range)  ibuprofen (ADVIL) tablet 800 mg (has no administration in time range)    ED Course  I have reviewed the triage vital signs and the nursing notes.  Pertinent labs & imaging results that were available during my care of the patient were reviewed by me and considered in my medical decision making (see chart for details).    MDM Rules/Calculators/A&P                          55 year old male presents the emergency department lower back pain.  Is reproducible on exam, vitals are stable, he is neuro intact.  No acute injury, no fever.  No radiation into the abdomen/chest.  X-ray shows no acute finding.  After symptomatic treatment the patient has had significant improvement, continues to be ambulatory.  I believe that this is musculoskeletal.  Patient will be discharged and treated as an outpatient.  Discharge plan and strict return to ED precautions discussed, patient verbalizes understanding and agreement.  Final Clinical Impression(s) / ED Diagnoses Final  diagnoses:  None    Rx / DC Orders ED Discharge Orders    None       Lorelle Gibbs, DO 05/03/20 1019

## 2020-05-03 NOTE — Discharge Instructions (Addendum)
You have been seen and discharged from the emergency department.  Follow-up with your primary provider for reevaluation and further care.  You have been prescribed pain patches and a muscle relaxer.  Do not mix this muscle relaxer medication with alcohol or other sedating medications. Do not drive or do heavy physical activity and to know how this medication affects you.  It may cause drowsiness.  Take home medications as prescribed. If you have any worsening symptoms or further concerns for health please return to an emergency department for further evaluation.

## 2020-12-29 ENCOUNTER — Encounter (HOSPITAL_COMMUNITY): Payer: Self-pay

## 2020-12-29 ENCOUNTER — Emergency Department (HOSPITAL_COMMUNITY): Payer: BLUE CROSS/BLUE SHIELD

## 2020-12-29 ENCOUNTER — Other Ambulatory Visit: Payer: Self-pay

## 2020-12-29 ENCOUNTER — Emergency Department (HOSPITAL_COMMUNITY)
Admission: EM | Admit: 2020-12-29 | Discharge: 2020-12-29 | Disposition: A | Discharge status: Home / Self Care | Payer: BLUE CROSS/BLUE SHIELD | Attending: Emergency Medicine | Admitting: Emergency Medicine

## 2020-12-29 DIAGNOSIS — Z28310 Unvaccinated for covid-19: Secondary | ICD-10-CM | POA: Insufficient documentation

## 2020-12-29 DIAGNOSIS — I10 Essential (primary) hypertension: Secondary | ICD-10-CM | POA: Diagnosis not present

## 2020-12-29 DIAGNOSIS — Z79899 Other long term (current) drug therapy: Secondary | ICD-10-CM | POA: Diagnosis not present

## 2020-12-29 DIAGNOSIS — J111 Influenza due to unidentified influenza virus with other respiratory manifestations: Secondary | ICD-10-CM

## 2020-12-29 DIAGNOSIS — R509 Fever, unspecified: Secondary | ICD-10-CM | POA: Diagnosis present

## 2020-12-29 DIAGNOSIS — F17210 Nicotine dependence, cigarettes, uncomplicated: Secondary | ICD-10-CM | POA: Diagnosis not present

## 2020-12-29 DIAGNOSIS — J101 Influenza due to other identified influenza virus with other respiratory manifestations: Secondary | ICD-10-CM | POA: Insufficient documentation

## 2020-12-29 DIAGNOSIS — Z20822 Contact with and (suspected) exposure to covid-19: Secondary | ICD-10-CM | POA: Diagnosis not present

## 2020-12-29 LAB — RESP PANEL BY RT-PCR (FLU A&B, COVID) ARPGX2
Influenza A by PCR: POSITIVE — AB
Influenza B by PCR: NEGATIVE
SARS Coronavirus 2 by RT PCR: NEGATIVE

## 2020-12-29 MED ORDER — OSELTAMIVIR PHOSPHATE 75 MG PO CAPS: 75.0000 mg | ORAL_CAPSULE | Freq: Two times a day (BID) | ORAL | 0 refills | Status: AC

## 2020-12-29 NOTE — Discharge Instructions (Addendum)
Rest make sure you are drinking plenty of fluids.  Continue to treat any fever or body aches with Tylenol or Motrin which should help you feel better.  Get rechecked for any worsening symptoms, particularly weakness or if you develop shortness of breath.  You may take the Tamiflu prescribed which may help you recover from this illness sooner.

## 2020-12-29 NOTE — ED Notes (Signed)
Patient provided with gingerale.

## 2020-12-29 NOTE — ED Notes (Signed)
Pt states no nausea with drinking ginger ale.

## 2020-12-29 NOTE — ED Provider Notes (Signed)
[facility] EMERGENCY DEPARTMENT Provider Note   CSN: [id number] Arrival date & time: 12/29/20  0935     History Chief Complaint  Patient presents with   Fever    Leroy [name] is a 55 y.o. male with a history significant for hypertension, alcohol induced pancreatitis and hypercholesterolemia presenting for evaluation of subjective fever along with cough with 2 specific episodes of posttussive emesis, the last occurring early this morning.  He denies nausea or vomiting at baseline, also denies abdominal pain or distention, no diarrhea.  He does have generalized body aches and as mentioned his fevers have been subjective.  His 23 year old son developed similar symptoms 4 days ago and is currently being seen by his pediatrician as well.  Patient has not been flu vaccinated but has been COVID vaccinated.  He has taken Robitussin for cough relief.  He denies chest pain, shortness of breath, sore throat or headache, he does endorse generalized myalgias. Pt's sx started yesterday.  The history is provided by the patient.  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 Gala Romney;  friable anal canal, otherwise normal rectum, pedunculated polyp in mid sigmoid (hyperplastic).    COLONOSCOPY WITH PROPOFOL N/A 02/05/2020   Procedure: COLONOSCOPY WITH PROPOFOL;  Surgeon: Daneil Dolin, MD;  Location: [facility];  Service: Endoscopy;  Laterality: N/A;  12:45pm   POLYPECTOMY  02/05/2020   Procedure: POLYPECTOMY;  Surgeon: Daneil Dolin, MD;  Location: [facility];  Service: Endoscopy;;       Family History  Problem Relation Age of Onset   Colon cancer Neg Hx     Social History   Tobacco Use   Smoking status: Every Day    Packs/day: 0.50    Types: Cigarettes   Smokeless tobacco: Never  Substance Use Topics   Alcohol use: Yes    Comment: 8, 24 ounce beer a week.    Drug use: Yes    Types: Marijuana    Comment: sometimes per pt    Home Medications Prior to Admission medications   Medication Sig Start Date End Date Taking? Authorizing Provider  oseltamivir (TAMIFLU) 75 MG capsule Take 1 capsule (75 mg total) by mouth every 12 (twelve) hours. 12/29/20  Yes Jacqulin Brandenburger, Almyra Free, PA-C  amLODipine (NORVASC) 10 MG tablet Take 10 mg by mouth daily. 07/25/19   [provider]  Cholecalciferol (VITAMIN D-3) 125 MCG (5000 UT) TABS Take 5,000 Units by mouth daily.    [provider]  cyclobenzaprine (FLEXERIL) 5 MG tablet Take 1 tablet (5 mg total) by mouth 2 (two) times daily as needed for muscle spasms.  05/03/20   Horton, Alvin Critchley, DO  lidocaine (LIDODERM) 5 % Place 1 patch onto the skin daily. Remove & Discard patch within 12 hours or as directed by MD 05/03/20   Horton, Alvin Critchley, DO  simvastatin (ZOCOR) 40 MG tablet Take 40 mg by mouth daily. 08/21/19   [provider]    Allergies    Patient has no known allergies.  Review of Systems   Review of Systems  Constitutional:  Positive for fever.       He does endorse feeling hungry currently.  HENT: Negative.  Negative for congestion and sore throat.   Respiratory:  Positive  for cough. Negative for shortness of breath.   Cardiovascular:  Negative for chest pain, palpitations and leg swelling.  Gastrointestinal:  Negative for abdominal pain.       Denies nausea, vomiting induced by cough  Genitourinary: Negative.   Musculoskeletal:  Positive for myalgias. Negative for neck pain and neck stiffness.  Skin:  Negative for rash.  All other systems reviewed and are negative.  Physical Exam Updated Vital Signs BP 112/69   Pulse 99   Temp 99 F (37.2 C) (Oral)   Resp (!) 22   Ht 6\' 3"  (1.905 m)   Wt 88.5 kg   SpO2 100%   BMI 24.37 kg/m   Physical Exam Vitals and nursing note reviewed.  Constitutional:      Appearance: He is well-developed.  HENT:     Head: Normocephalic and atraumatic.  Eyes:     Conjunctiva/sclera: Conjunctivae normal.  Cardiovascular:     Rate and Rhythm: Normal rate and regular rhythm.     Heart sounds: Normal heart sounds.  Pulmonary:     Effort: Pulmonary effort is normal. No respiratory distress.     Breath sounds: Rhonchi present. No wheezing.     Comments: Rhonchi right base Abdominal:     General: Bowel sounds are normal.     Palpations: Abdomen is soft.     Tenderness: There is no abdominal tenderness.  Musculoskeletal:        General: Normal range of motion.     Cervical back: Normal range of motion.     Right lower leg: No edema.     Left lower leg: No edema.  Skin:    General: Skin is warm and dry.  Neurological:     Mental Status: He is alert.    ED Results / Procedures / Treatments   Labs (all labs ordered are listed, but only abnormal results are displayed) Labs Reviewed  RESP PANEL BY RT-PCR (FLU A&B, COVID) ARPGX2 - Abnormal; Notable for the following components:      Result Value   Influenza A by PCR POSITIVE (*)    All other components within normal limits    EKG None  Radiology DG Chest Portable 1 View  Result Date: 12/29/2020 CLINICAL DATA:  Fever, cough, and vomiting since yesterday. Hx  HTN, smoker EXAM: PORTABLE CHEST - 1 VIEW COMPARISON:  01/13/2019 FINDINGS: Lungs are clear. Heart size and mediastinal contours are within normal limits. No effusion. Visualized bones unremarkable. IMPRESSION: No acute cardiopulmonary disease. Electronically Signed   By: Lucrezia Europe M.D.   On: 12/29/2020 10:54    Procedures Procedures   Medications Ordered in ED Medications - No data to display  ED Course  I have reviewed the triage vital signs and the nursing notes.  Pertinent labs & imaging results that were available during my care of the patient were  reviewed by me and considered in my medical decision making (see chart for details).    MDM Rules/Calculators/A&P                           With influenza A, his chest x-ray is negative for pneumonia.  He has tolerated p.o. intake here without any nausea or vomiting.  Discussed home treatment including pros and cons of Tamiflu.  Patient is interested in a prescription for this medication.  He was given strict return precautions for any worsening symptoms.  Also advised taking Tylenol or ibuprofen for body aches and fever.  The patient appears reasonably screened and/or stabilized for discharge and I doubt any other medical condition or other [facility] requiring further screening, evaluation, or treatment in the ED at this time prior to discharge.  Final Clinical Impression(s) / ED Diagnoses Final diagnoses:  Influenza    Rx / DC Orders ED Discharge Orders          Ordered    oseltamivir (TAMIFLU) 75 MG capsule  Every 12 hours        12/29/20 1218             Evalee Jefferson, PA-C 12/29/20 [place], [place], DO 12/30/20 [phone]

## 2020-12-29 NOTE — ED Triage Notes (Signed)
Complaints of fever and vomiting that started the previous day.

## 2021-03-03 ENCOUNTER — Ambulatory Visit: Payer: BLUE CROSS/BLUE SHIELD | Admitting: Internal Medicine

## 2021-05-03 ENCOUNTER — Other Ambulatory Visit (HOSPITAL_COMMUNITY)
Admission: RE | Admit: 2021-05-03 | Discharge: 2021-05-03 | Disposition: A | Discharge status: Home / Self Care | Payer: BLUE CROSS/BLUE SHIELD | Source: Ambulatory Visit | Attending: Internal Medicine | Admitting: Internal Medicine

## 2021-05-03 ENCOUNTER — Ambulatory Visit (HOSPITAL_COMMUNITY)
Admission: RE | Admit: 2021-05-03 | Discharge: 2021-05-03 | Disposition: A | Discharge status: Home / Self Care | Payer: BLUE CROSS/BLUE SHIELD | Source: Ambulatory Visit | Attending: Internal Medicine | Admitting: Internal Medicine

## 2021-05-03 ENCOUNTER — Other Ambulatory Visit: Payer: Self-pay

## 2021-05-03 DIAGNOSIS — R000 Tachycardia, unspecified: Secondary | ICD-10-CM | POA: Diagnosis present

## 2021-05-03 DIAGNOSIS — E785 Hyperlipidemia, unspecified: Secondary | ICD-10-CM | POA: Diagnosis not present

## 2021-05-03 LAB — CBC WITH DIFFERENTIAL/PLATELET
Abs Immature Granulocytes: 0.01 10*3/uL (ref 0.00–0.07)
Basophils Absolute: 0 10*3/uL (ref 0.0–0.1)
Basophils Relative: 1 %
Eosinophils Absolute: 0.1 10*3/uL (ref 0.0–0.5)
Eosinophils Relative: 1 %
HCT: 44.1 % (ref 39.0–52.0)
Hemoglobin: 14.6 g/dL (ref 13.0–17.0)
Immature Granulocytes: 0 %
Lymphocytes Relative: 30 %
Lymphs Abs: 1.6 10*3/uL (ref 0.7–4.0)
MCH: 28.7 pg (ref 26.0–34.0)
MCHC: 33.1 g/dL (ref 30.0–36.0)
MCV: 86.6 fL (ref 80.0–100.0)
Monocytes Absolute: 0.6 10*3/uL (ref 0.1–1.0)
Monocytes Relative: 12 %
Neutro Abs: 3 10*3/uL (ref 1.7–7.7)
Neutrophils Relative %: 56 %
Platelets: 282 10*3/uL (ref 150–400)
RBC: 5.09 MIL/uL (ref 4.22–5.81)
RDW: 15.1 % (ref 11.5–15.5)
WBC: 5.4 10*3/uL (ref 4.0–10.5)
nRBC: 0 % (ref 0.0–0.2)

## 2021-06-14 ENCOUNTER — Ambulatory Visit: Payer: BLUE CROSS/BLUE SHIELD | Admitting: Internal Medicine

## 2022-07-01 IMAGING — CT CT ABD-PELV W/ CM
2 of 5 series · 15 of 46 positions shown, 17 images · IV contrast (Omnipaque or Isovue)
Comparison: None.

CLINICAL DATA: Nausea, vomiting, abdominal pain

EXAM:
CT ABDOMEN AND PELVIS WITH CONTRAST
TECHNIQUE: Multidetector CT imaging of the abdomen and pelvis was performed
using the standard protocol following bolus administration of
intravenous contrast.
CONTRAST:  100mL OMNIPAQUE IOHEXOL 300 MG/ML  SOLN

[Series 2: axial st · axial · 0.68mm/px · z∈[+956,+1446]mm · 12 of 110 slices shown, 14 images]
[im 6/110  soft-tissue]
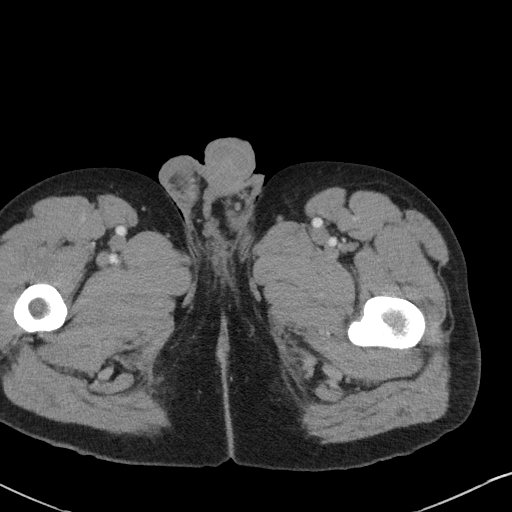
[im 6/110  bone]
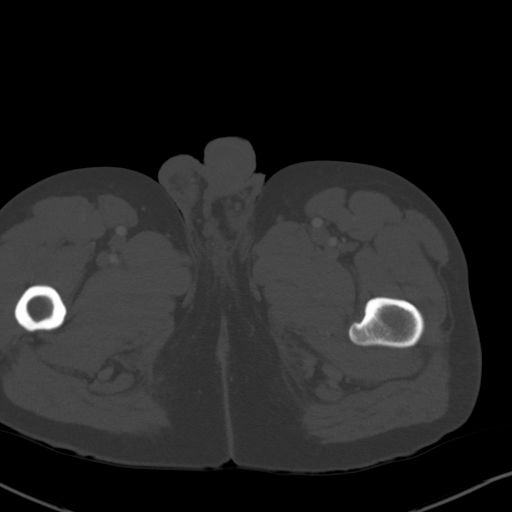
[im 18/110  soft-tissue]
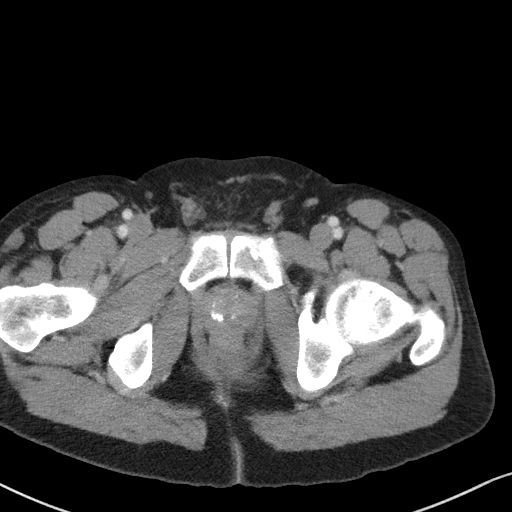
[im 23/110  soft-tissue]
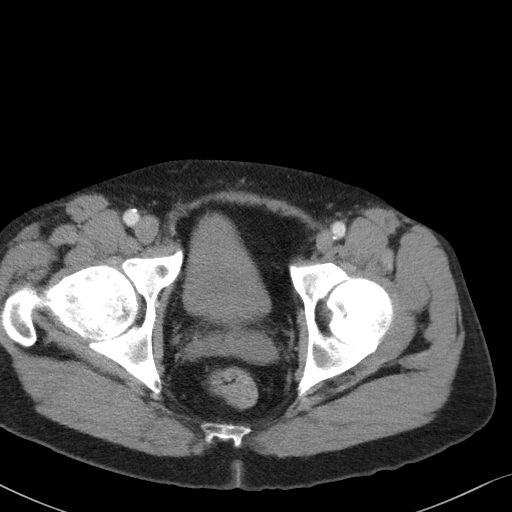
[im 35/110  soft-tissue]
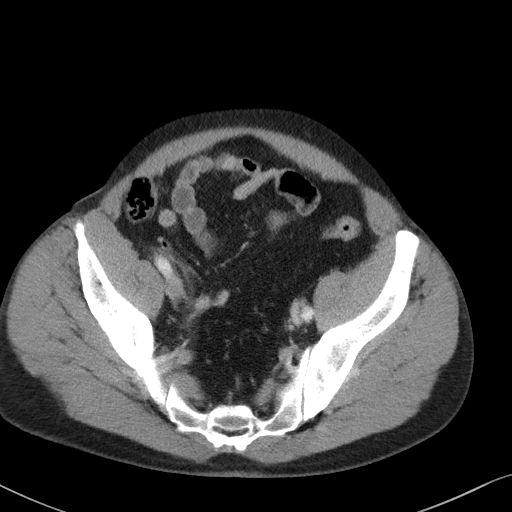
[im 41/110  soft-tissue]
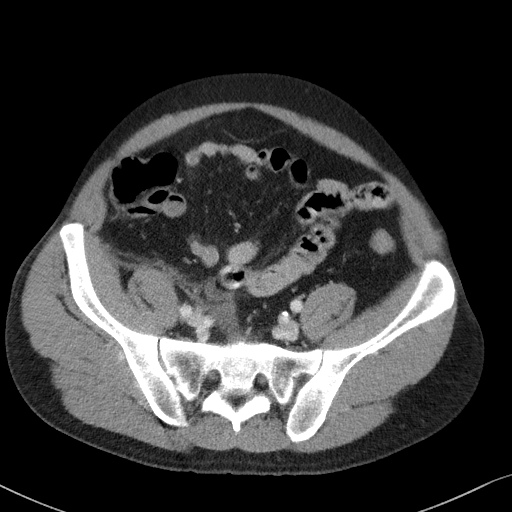
[im 52/110  soft-tissue]
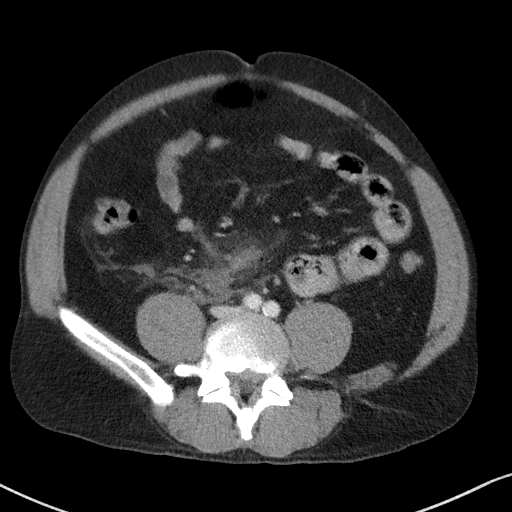
[im 58/110  soft-tissue]
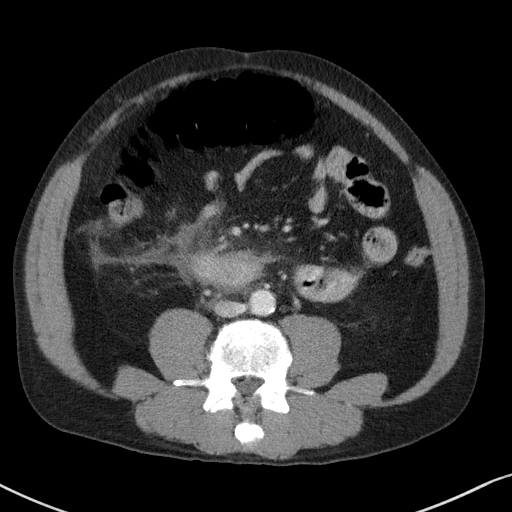
[im 69/110  soft-tissue]
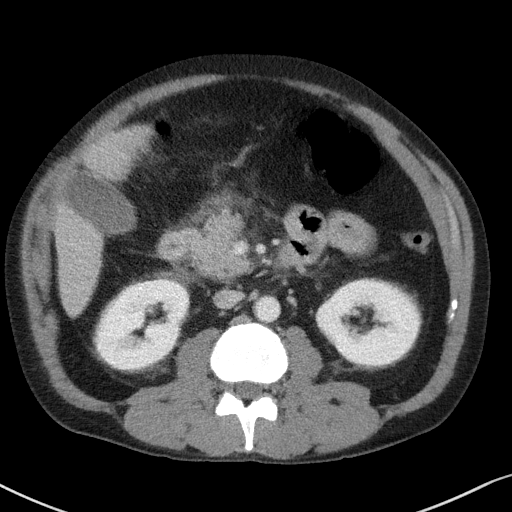
[im 75/110  soft-tissue]
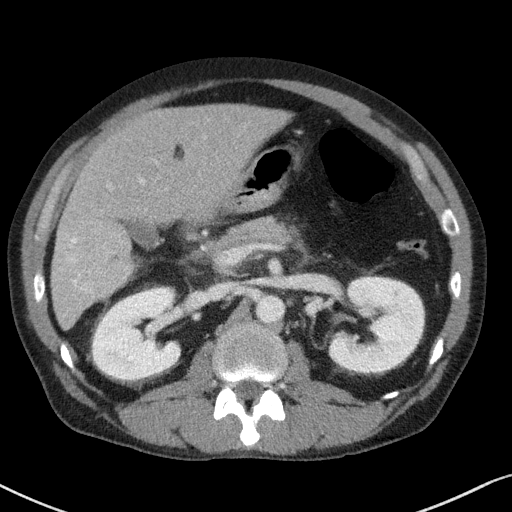
[im 75/110  bone]
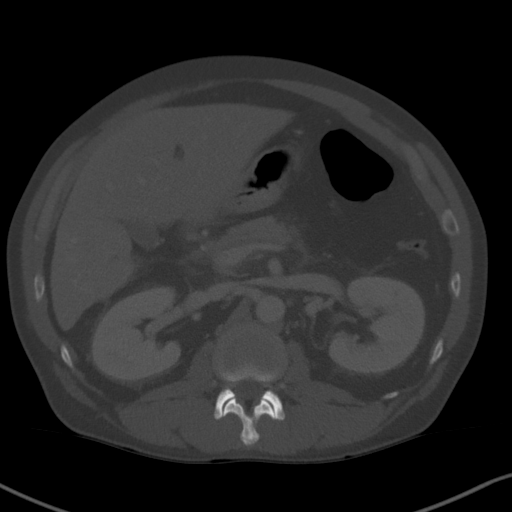
[im 87/110  soft-tissue]
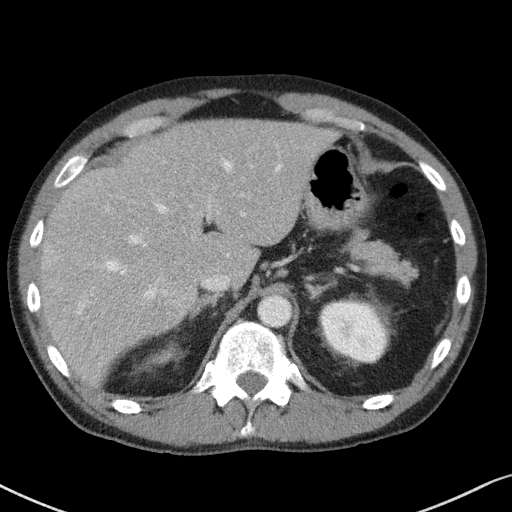
[im 92/110  soft-tissue]
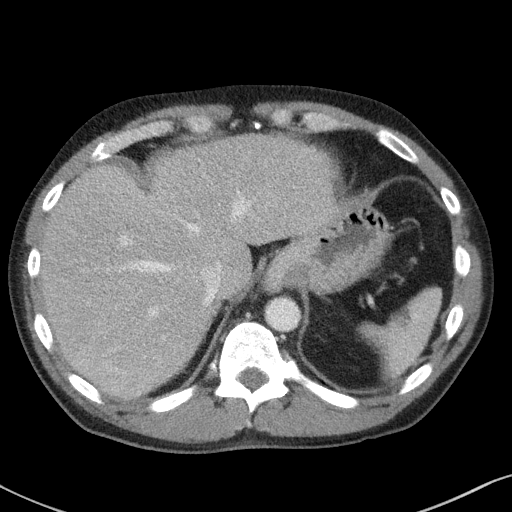
[im 104/110  soft-tissue]
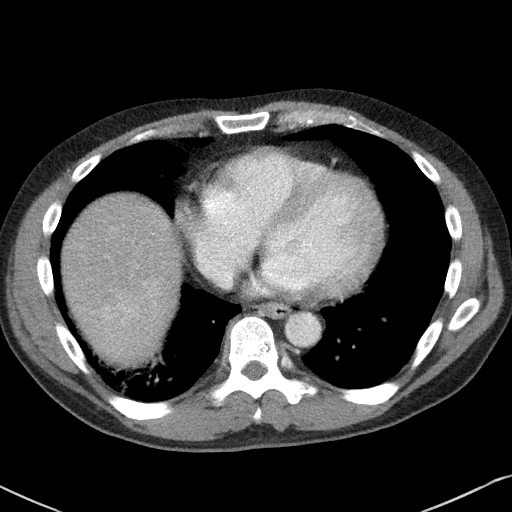

[Series 5: coronal st · coronal · 0.68mm/px · 3 of 103 slices shown]
[im 35/103  soft-tissue]
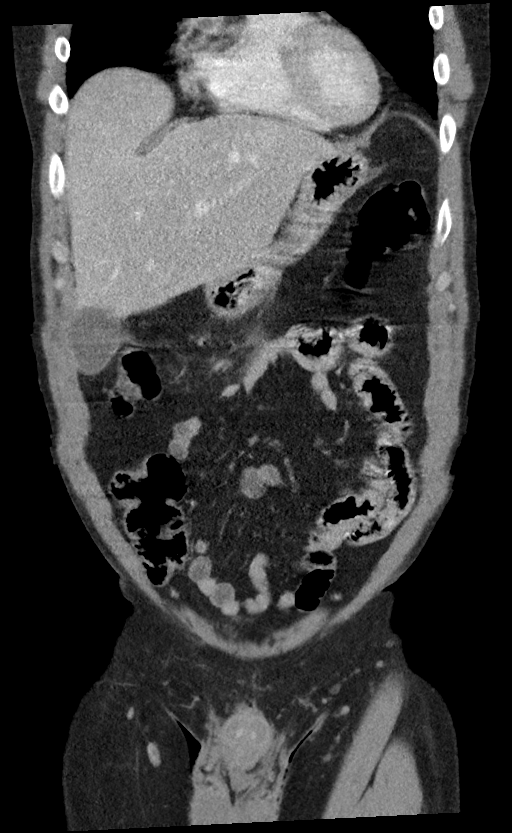
[im 46/103  soft-tissue]
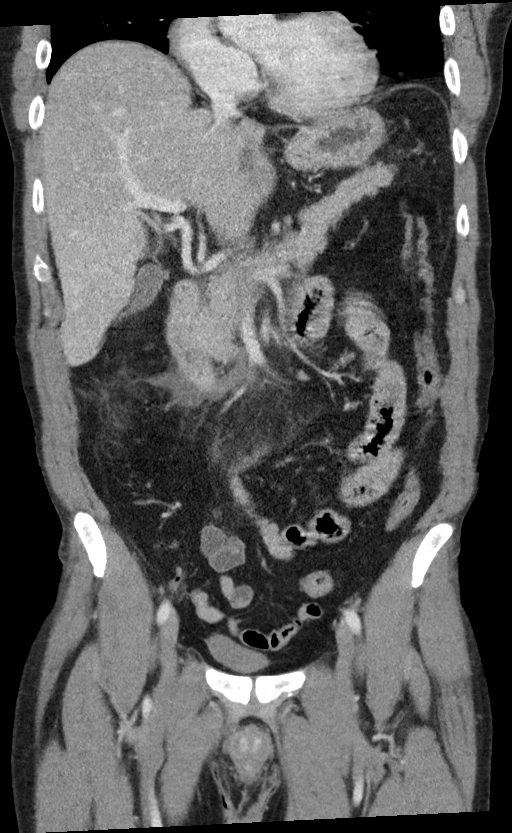
[im 57/103  soft-tissue]
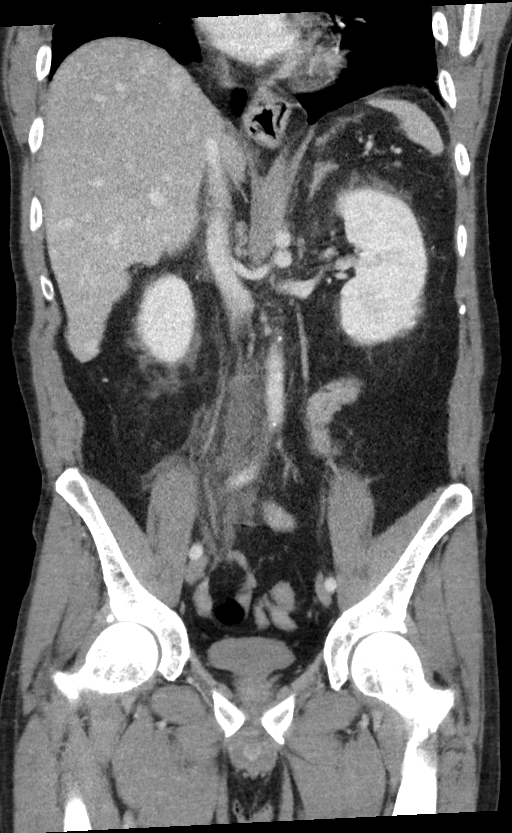

[15 of 46 positions shown; findings below may reference images not displayed]

FINDINGS: Lower chest: No acute abnormality.

Hepatobiliary: Diffuse fatty infiltration. Calcification within the
right hepatic lobe appears benign. No suspicious focal hepatic
abnormality. Gallbladder unremarkable.

Pancreas: There is inflammation/stranding around the pancreatic head
and uncinate process compatible with acute pancreatitis. Fluid
tracks in the anterior pararenal space and around the 2nd and 3rd
portions of the duodenum.

Spleen: No focal abnormality.  Normal size.

Adrenals/Urinary Tract: No adrenal abnormality. No focal renal
abnormality. No stones or hydronephrosis. Urinary bladder is
unremarkable.

Stomach/Bowel: Normal appendix. Stomach, large and small bowel
grossly unremarkable.

Vascular/Lymphatic: Scattered aortic atherosclerosis. No evidence of
aneurysm or adenopathy.

Reproductive: Prostate enlargement with central calcifications.

Other: No free fluid or free air.

Musculoskeletal: No acute bony abnormality.
IMPRESSION: Stranding around the pancreatic head and uncinate process continuing
into the right pararenal space compatible with acute pancreatitis.

Aortic atherosclerosis.

## 2022-07-03 IMAGING — CT CT ABD-PELV W/ CM
2 of 5 series · 15 of 46 positions shown, 17 images · IV contrast (Omnipaque or Isovue)
Comparison: 10/15/2019

CLINICAL DATA: Clinically worsening pancreatitis, abdominal pain

EXAM:
CT ABDOMEN AND PELVIS WITH CONTRAST
TECHNIQUE: Multidetector CT imaging of the abdomen and pelvis was performed
using the standard protocol following bolus administration of
intravenous contrast.
CONTRAST:  100mL OMNIPAQUE IOHEXOL 300 MG/ML  SOLN

[Series 2: axial st · axial · 0.75mm/px · z∈[-319,+156]mm · 12 of 109 slices shown, 14 images]
[im 7/109  soft-tissue]
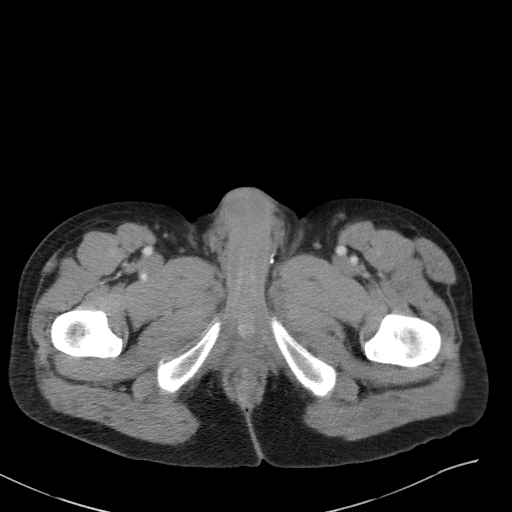
[im 7/109  bone]
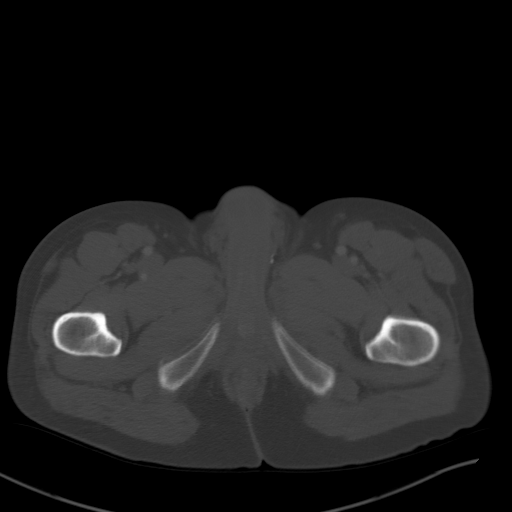
[im 20/109  soft-tissue]
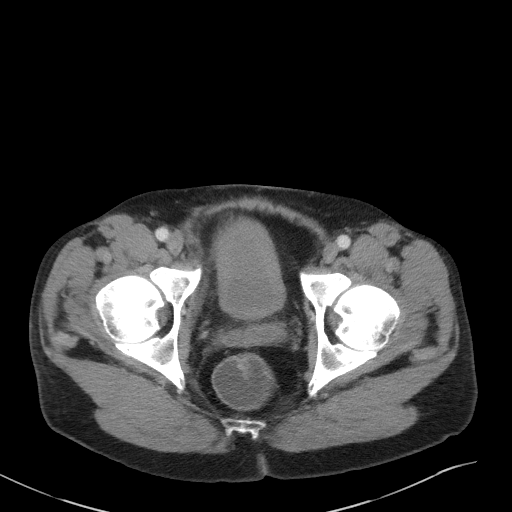
[im 26/109  soft-tissue]
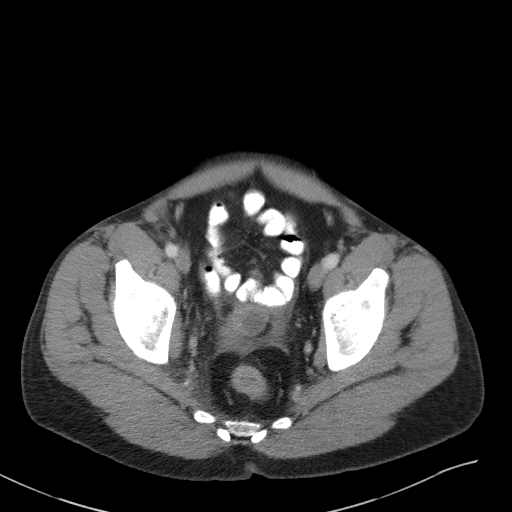
[im 32/109  soft-tissue]
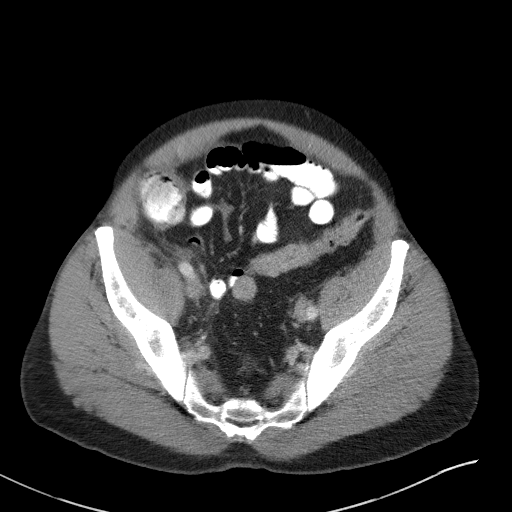
[im 45/109  soft-tissue]
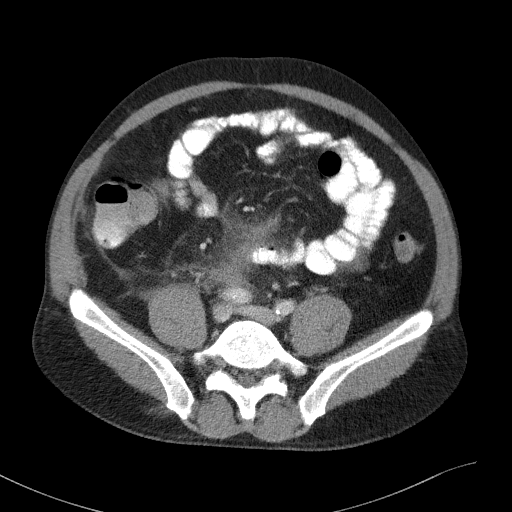
[im 51/109  soft-tissue]
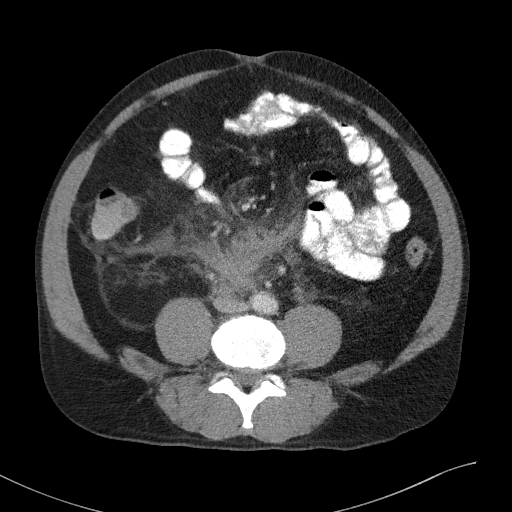
[im 58/109  soft-tissue]
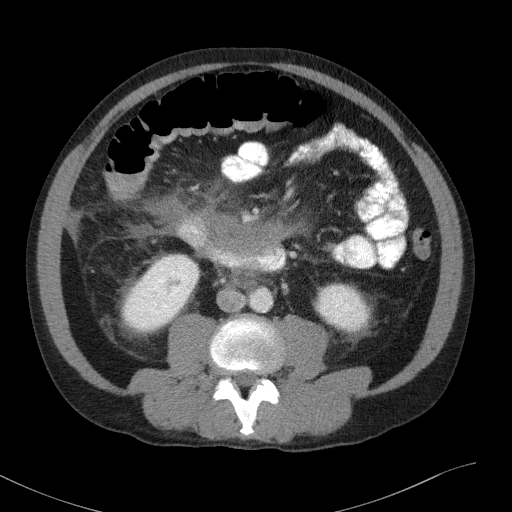
[im 70/109  soft-tissue]
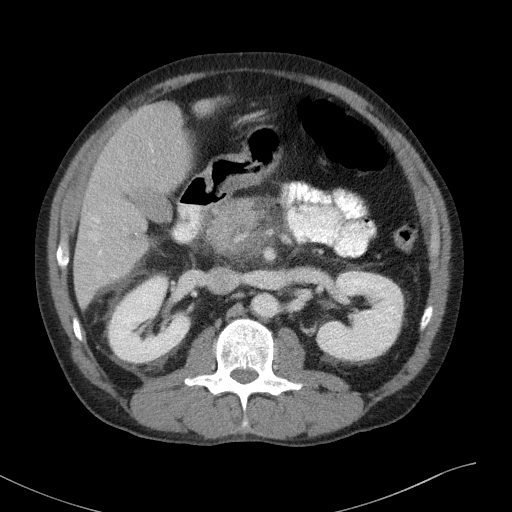
[im 77/109  soft-tissue]
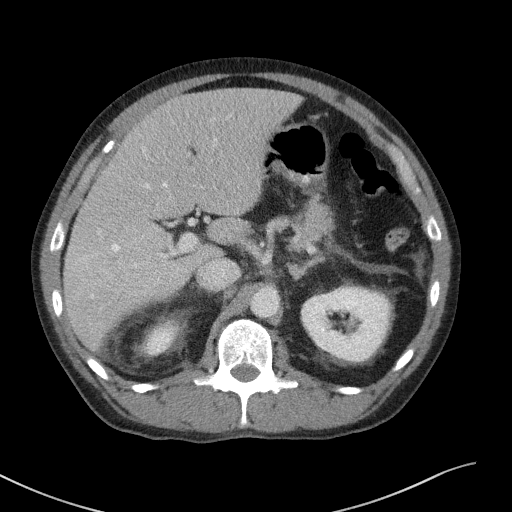
[im 77/109  bone]
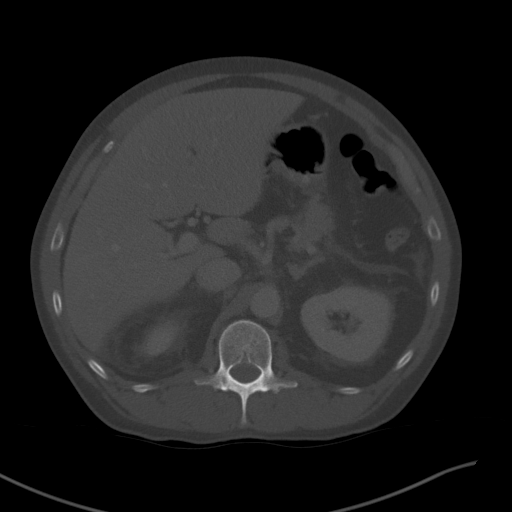
[im 83/109  soft-tissue]
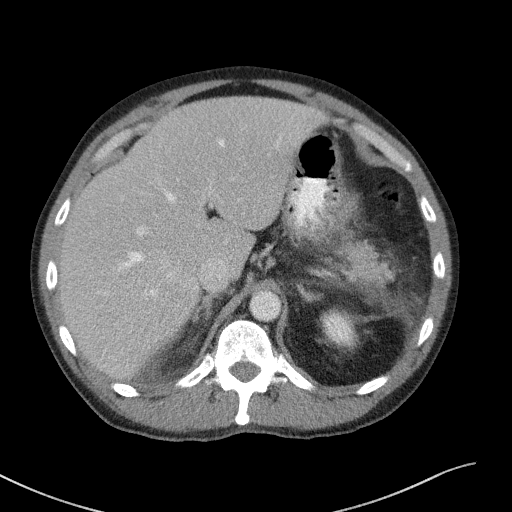
[im 96/109  soft-tissue]
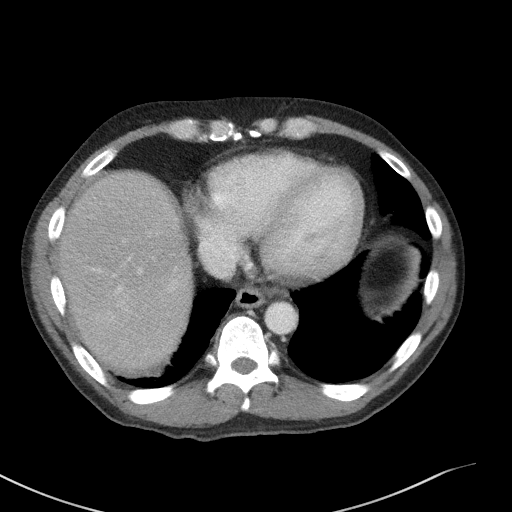
[im 102/109  soft-tissue]
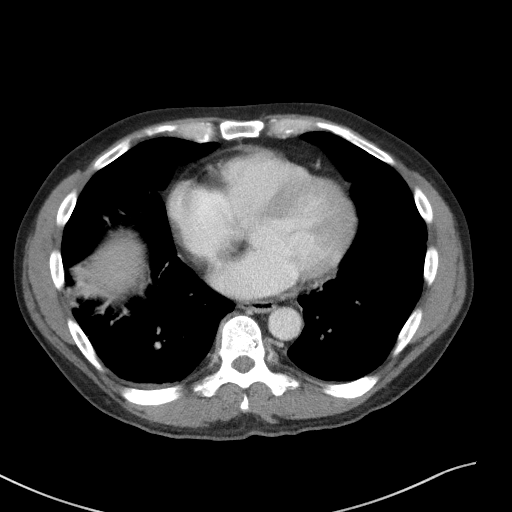

[Series 4: coronal st · coronal · 0.80mm/px · 3 of 117 slices shown]
[im 39/117  soft-tissue]
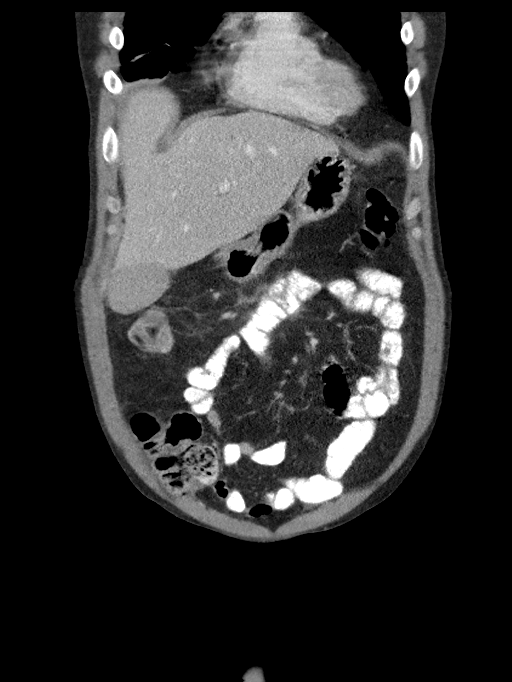
[im 52/117  soft-tissue]
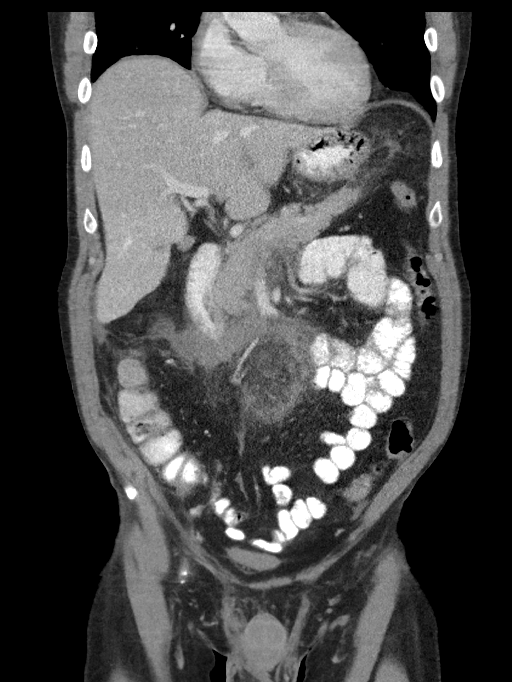
[im 65/117  soft-tissue]
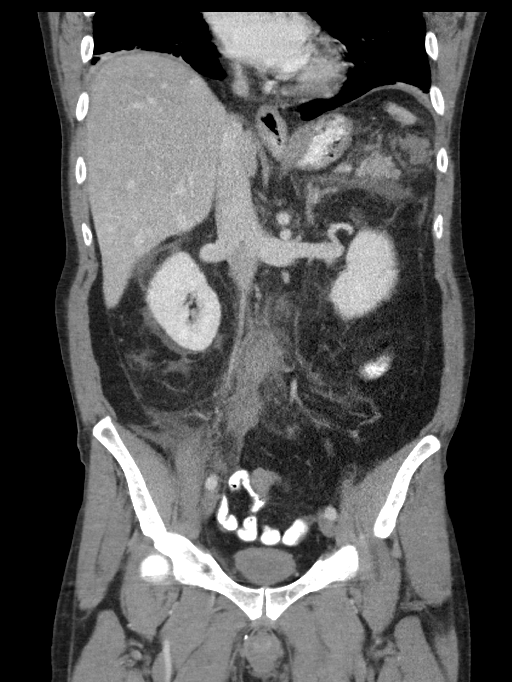

[15 of 46 positions shown; findings below may reference images not displayed]

FINDINGS: Lower chest: Scattered areas of scarring and atelectasis at the lung
bases. No acute pleural or parenchymal lung disease.

Hepatobiliary: Heterogeneous decreased attenuation of the liver
consistent with hepatic steatosis. Coarse calcification inferior
right lobe appears benign. High density material within the
gallbladder may reflect sludge or vicarious excretion of previously
administered contrast. No evidence of cholelithiasis or
cholecystitis. No biliary dilation.

Pancreas: Progressive inflammatory changes are seen surrounding the
head and uncinate process of the pancreas. No organized fluid
collection, abscess, or pseudocyst. The pancreatic parenchyma
enhances normally.

Spleen: Normal in size without focal abnormality.

Adrenals/Urinary Tract: Adrenal glands are unremarkable. Kidneys are
normal, without renal calculi, focal lesion, or hydronephrosis.
Bladder is unremarkable.

Stomach/Bowel: No bowel obstruction or ileus. No wall thickening or
inflammatory change. Normal appendix right lower quadrant.

Vascular/Lymphatic: Mild atherosclerosis throughout the aorta and
its branches.

The superior mesenteric vein, splenic vein, and portal vein remain
patent. However, there is increasing extrinsic mass effect upon the
confluence of the SMV and splenic vein due to the inflammatory
changes related to pancreatitis. No evidence of thrombosis.

No pathologic adenopathy.

Reproductive: Prostate is unremarkable.

Other: Free fluid tracks inferiorly from the pancreas into the lower
central mesentery and right lower quadrant. No free intraperitoneal
gas. No abdominal wall hernia.

Musculoskeletal: No acute or destructive bony lesions. Reconstructed
images demonstrate no additional findings.
IMPRESSION: 1. Worsening acute pancreatitis, with increasing peripancreatic
inflammatory changes and free fluid. There is no evidence of
pseudocyst, fluid collection, or abscess.
2. Increasing extrinsic mass effect at the confluence of the SMV and
splenic vein due to the acute pancreatitis described above. No
evidence of portal vein thrombosis.
3. Increasing fluid fluid within the central mesentery and right
lower quadrant related to pancreatitis.
4. Hepatic steatosis.

## 2022-11-06 ENCOUNTER — Other Ambulatory Visit: Payer: Self-pay

## 2022-11-06 ENCOUNTER — Emergency Department (HOSPITAL_COMMUNITY)
Admission: EM | Admit: 2022-11-06 | Discharge: 2022-11-06 | Disposition: A | Discharge status: Home / Self Care | Payer: BLUE CROSS/BLUE SHIELD | Attending: Emergency Medicine | Admitting: Emergency Medicine

## 2022-11-06 ENCOUNTER — Encounter (HOSPITAL_COMMUNITY): Payer: Self-pay | Admitting: Emergency Medicine

## 2022-11-06 DIAGNOSIS — M5441 Lumbago with sciatica, right side: Secondary | ICD-10-CM | POA: Insufficient documentation

## 2022-11-06 DIAGNOSIS — Z79899 Other long term (current) drug therapy: Secondary | ICD-10-CM | POA: Diagnosis not present

## 2022-11-06 DIAGNOSIS — I10 Essential (primary) hypertension: Secondary | ICD-10-CM | POA: Diagnosis not present

## 2022-11-06 DIAGNOSIS — M545 Low back pain, unspecified: Secondary | ICD-10-CM | POA: Diagnosis present

## 2022-11-06 MED ORDER — CYCLOBENZAPRINE HCL 5 MG PO TABS: 5.0000 mg | ORAL_TABLET | Freq: Three times a day (TID) | ORAL | 0 refills | Status: AC | PRN

## 2022-11-06 MED ORDER — LIDOCAINE 5 % EX PTCH: 1.0000 | MEDICATED_PATCH | CUTANEOUS | 0 refills | Status: AC

## 2022-11-06 NOTE — ED Triage Notes (Signed)
Pt presents with sciatic pain, radiating up right leg into buttocks, x 1 week.

## 2022-11-06 NOTE — ED Provider Notes (Signed)
[place] EMERGENCY DEPARTMENT AT [facility] Provider Note   CSN: [id number] Arrival date & time: 11/06/22  1156     History  Chief Complaint  Patient presents with   Sciatic Pain    Right    Leroy [name] is a 57 y.o. male, history of hypertension, hyperlipidemia, who presents to the ED secondary to right [place] lower back pain, radiating to the right buttocks, has been going on for the last 3 days.  States that sharp and stabbing goes down his leg, worse with movements.  Has not taken anything for the pain.  Denies any numbness of the groin, abdominal pain, chest pain.  No recent injuries.     Home Medications Prior to Admission medications   Medication Sig Start Date End Date Taking? Authorizing Provider  cyclobenzaprine (FLEXERIL) 5 MG tablet Take 1 tablet (5 mg total) by mouth 3 (three) times daily as needed for muscle spasms. 11/06/22  Yes Marytza Grandpre L, PA  lidocaine (LIDODERM) 5 % Place 1 patch onto the skin daily. Remove & Discard patch within 12 hours or as directed by MD 11/06/22  Yes Matsue Strom L, PA  amLODipine (NORVASC) 10 MG tablet Take 10 mg by mouth daily. 07/25/19   [provider]  Cholecalciferol (VITAMIN D-3) 125 MCG (5000 UT) TABS Take 5,000 Units by mouth daily.    [provider]  oseltamivir (TAMIFLU) 75 MG capsule Take 1 capsule (75 mg total) by mouth every 12 (twelve) hours. 12/29/20   Burgess Amor, PA-C  simvastatin (ZOCOR) 40 MG tablet Take 40 mg by mouth daily. 08/21/19   [provider]      Allergies    Patient has no known allergies.    Review of Systems   Review of Systems  Genitourinary:  Negative for dysuria.  Musculoskeletal:  Positive for back pain.  Skin:  Negative for wound.    Physical Exam Updated Vital Signs BP (!) 133/100   Pulse 86   Temp 99.1 F (37.3 C) (Oral)   Resp 18   Ht 6\' 3"  (1.905 m)   Wt 86.2 kg   SpO2 98%   BMI 23.75 kg/m  Physical Exam Constitutional:      General:  He is not in acute distress.    Appearance: He is well-developed. He is not toxic-appearing.  HENT:     Head: Normocephalic and atraumatic.  Abdominal:     General: There is no distension.     Palpations: Abdomen is soft.     Tenderness: There is no abdominal tenderness.  Musculoskeletal:     Cervical back: Normal range of motion and neck supple. No spinous process tenderness or muscular tenderness.     Comments: No obvious deformity, appreciable swelling, erythema, ecchymosis, significant open wounds, or increased warmth.  Back: No point/focal vertebral tenderness, no palpable step off or crepitus.  Tenderness to palpation of the right perispinal muscles Lower extremities: ranging @ all major joints. No focal bony tenderness.  Tenderness to right buttocks  Skin:    General: Skin is warm and dry.     Findings: No rash.     Comments: No rash or wound noted  Neurological:     Mental Status: He is alert.     Comments: Sensation grossly intact to bilateral lower extremities. 5/5 symmetric strength with plantar/dorsiflexion bilaterally. Gait is intact without obvious foot drop.  Positive straight leg raise on the right lower extremity     ED Results / Procedures / Treatments  Labs (all labs ordered are listed, but only abnormal results are displayed) Labs Reviewed - No data to display  EKG None  Radiology No results found.  Procedures Procedures    Medications Ordered in ED Medications - No data to display  ED Course/ Medical Decision Making/ A&P                                 Medical Decision Making Patient is a 57-year-old male, here for right buttocks pain, has been going on for medical 3 days.  He states that sharp and stabbing goes down his leg.  Starts from his lower back.  Denies any trauma.  He has tenderness to palpation of right paraspinal muscles, as well as a positive straight leg raise, this likely aligns with sciatica, he states he has had this before, feels  similar.  No fever, chills, loss of urine, or stool. No groin numbness. No rashes. Discussed with patient, will need to f/u with PCP. Discussed return precautions, discharged home    Final Clinical Impression(s) / ED Diagnoses Final diagnoses:  Acute right-sided low back pain with right-sided sciatica    Rx / DC Orders ED Discharge Orders          Ordered    cyclobenzaprine (FLEXERIL) 5 MG tablet  3 times daily PRN        11/06/22 1353    lidocaine (LIDODERM) 5 %  Every 24 hours        11/06/22 1353              Nava Song, [place], PA 11/06/22 1358    Gloris [place], MD 11/06/22 [phone]

## 2022-11-06 NOTE — Discharge Instructions (Signed)
Please follow-up with your primary care doctor, return to the ER if you feel like your symptoms are worsening.  If you have any loss of bowel, bladder, groin numbness, return to the ER immediately
